# Patient Record
Sex: Male | Born: 1965 | Race: White | Hispanic: No | Marital: Married | Smoking: Never smoker
Health system: Southern US, Community
[De-identification: ages and names within clinical notes are randomized; demographics above are authoritative.]

## PROBLEM LIST (undated history)

## (undated) DIAGNOSIS — R51 Headache: Secondary | ICD-10-CM

## (undated) DIAGNOSIS — J4520 Mild intermittent asthma, uncomplicated: Secondary | ICD-10-CM

## (undated) DIAGNOSIS — R209 Unspecified disturbances of skin sensation: Secondary | ICD-10-CM

## (undated) DIAGNOSIS — M2550 Pain in unspecified joint: Secondary | ICD-10-CM

## (undated) DIAGNOSIS — H9190 Unspecified hearing loss, unspecified ear: Secondary | ICD-10-CM

## (undated) DIAGNOSIS — K219 Gastro-esophageal reflux disease without esophagitis: Secondary | ICD-10-CM

## (undated) DIAGNOSIS — M1990 Unspecified osteoarthritis, unspecified site: Secondary | ICD-10-CM

## (undated) DIAGNOSIS — M542 Cervicalgia: Secondary | ICD-10-CM

## (undated) DIAGNOSIS — M7711 Lateral epicondylitis, right elbow: Secondary | ICD-10-CM

## (undated) DIAGNOSIS — E785 Hyperlipidemia, unspecified: Secondary | ICD-10-CM

## (undated) DIAGNOSIS — R519 Headache, unspecified: Secondary | ICD-10-CM

## (undated) HISTORY — DX: Headache: R51

## (undated) HISTORY — DX: Unspecified disturbances of skin sensation: R20.9

## (undated) HISTORY — DX: Hyperlipidemia, unspecified: E78.5

## (undated) HISTORY — DX: Headache, unspecified: R51.9

## (undated) HISTORY — DX: Mild intermittent asthma, uncomplicated: J45.20

## (undated) HISTORY — DX: Pain in unspecified joint: M25.50

## (undated) HISTORY — DX: Unspecified hearing loss, unspecified ear: H91.90

## (undated) HISTORY — DX: Lateral epicondylitis, right elbow: M77.11

## (undated) HISTORY — DX: Cervicalgia: M54.2

## (undated) HISTORY — DX: Gastro-esophageal reflux disease without esophagitis: K21.9

## (undated) HISTORY — DX: Unspecified osteoarthritis, unspecified site: M19.90

---

## 2006-12-28 ENCOUNTER — Emergency Department: Payer: Self-pay | Admitting: Unknown Physician Specialty

## 2006-12-28 ENCOUNTER — Other Ambulatory Visit: Payer: Self-pay

## 2006-12-30 ENCOUNTER — Ambulatory Visit: Payer: Self-pay | Admitting: Unknown Physician Specialty

## 2012-03-22 ENCOUNTER — Ambulatory Visit: Payer: Self-pay | Admitting: Family Medicine

## 2012-04-13 ENCOUNTER — Emergency Department: Payer: Self-pay

## 2012-04-13 LAB — COMPREHENSIVE METABOLIC PANEL
Albumin: 4.1 g/dL (ref 3.4–5.0)
Anion Gap: 8 (ref 7–16)
BUN: 16 mg/dL (ref 7–18)
Calcium, Total: 8.6 mg/dL (ref 8.5–10.1)
Co2: 24 mmol/L (ref 21–32)
EGFR (African American): >60
Glucose: 135 mg/dL — ABNORMAL HIGH (ref 65–99)
Potassium: 4.3 mmol/L (ref 3.5–5.1)
SGOT(AST): 29 U/L (ref 15–37)
SGPT (ALT): 38 U/L (ref 12–78)

## 2012-04-13 LAB — CBC
HCT: 43 % (ref 40.0–52.0)
MCHC: 34.8 g/dL (ref 32.0–36.0)
Platelet: 283 10*3/uL (ref 150–440)
RBC: 4.68 10*6/uL (ref 4.40–5.90)
RDW: 12.2 % (ref 11.5–14.5)
WBC: 6.1 10*3/uL (ref 3.8–10.6)

## 2012-04-13 LAB — CK: CK, Total: 191 U/L (ref 35–232)

## 2012-04-13 LAB — ACETAMINOPHEN LEVEL: Acetaminophen: 3 ug/mL — ABNORMAL LOW

## 2014-04-19 LAB — LIPID PANEL
Cholesterol: 245 mg/dL — AB (ref 0–200)
HDL: 44 mg/dL (ref 35–70)
LDL Cholesterol: 175 mg/dL
Triglycerides: 129 mg/dL (ref 40–160)

## 2014-04-19 LAB — PSA: PSA: NORMAL

## 2014-05-16 IMAGING — CR DG CHEST 2V
1 series · 4 of 4 positions shown · non-contrast
Comparison: none

REASON FOR EXAM: fever, cough
COMMENTS:

PROCEDURE:     DXR - DXR CHEST PA (OR AP) AND LATERAL  - April 13, 2012 [DATE]
RESULT:     Comparison: None

[Series 1: pa · 0.17mm/px · 4 of 4 slices shown]
[im 1/4]
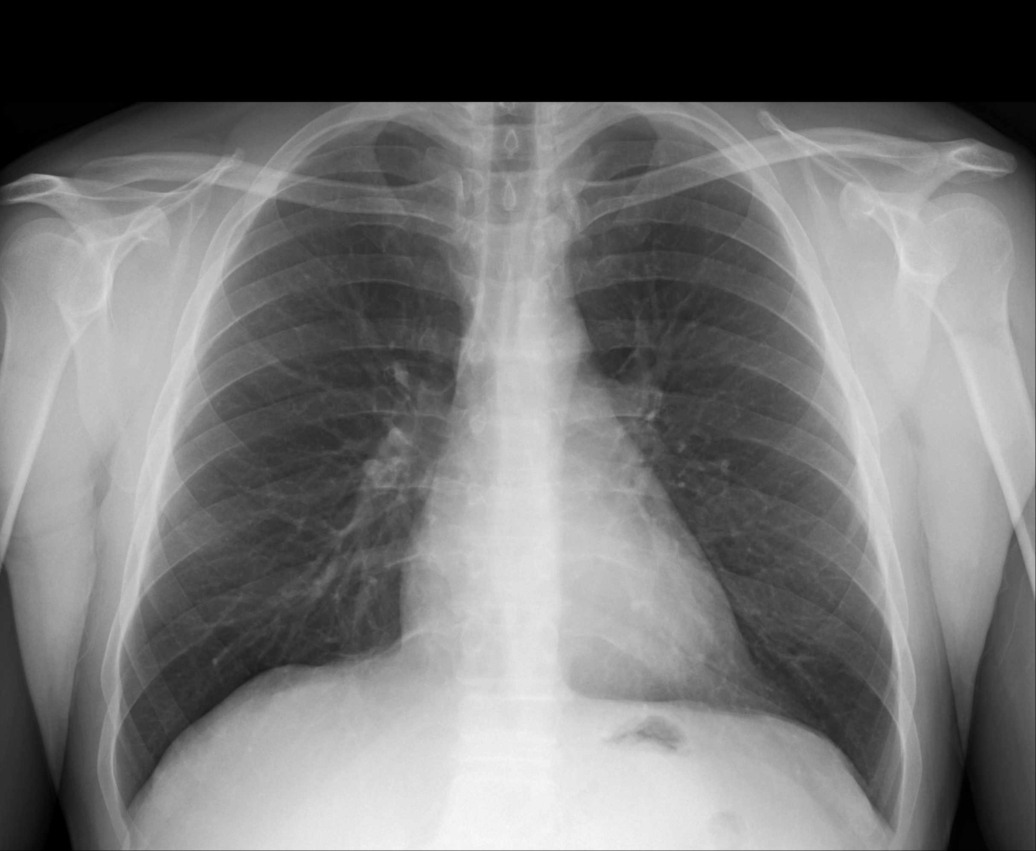
[im 2/4]
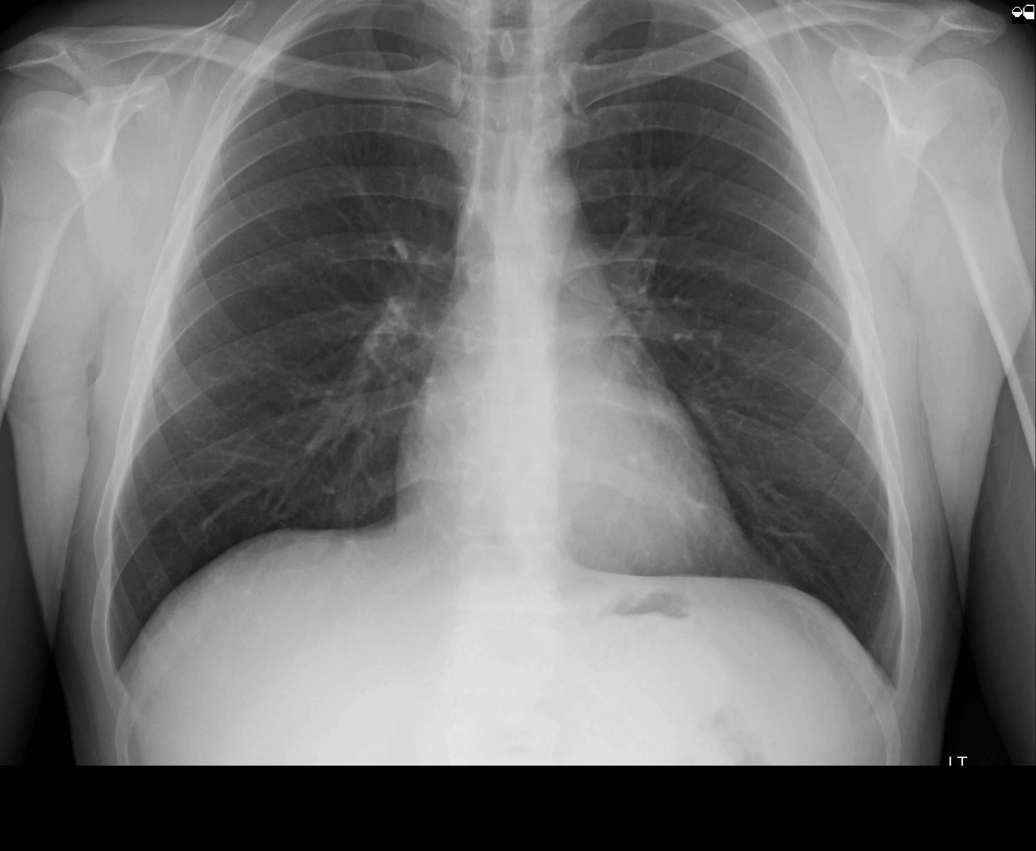
[im 3/4]
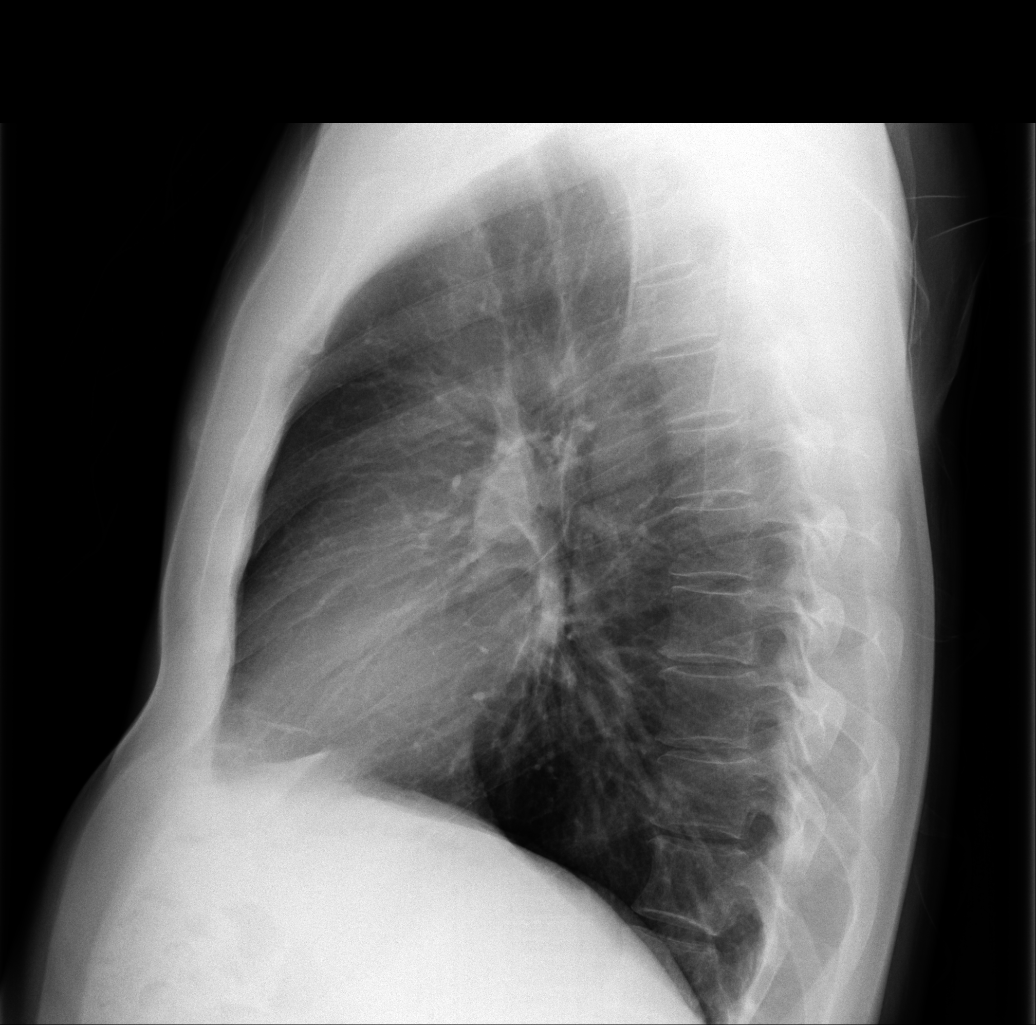
[im 4/4]
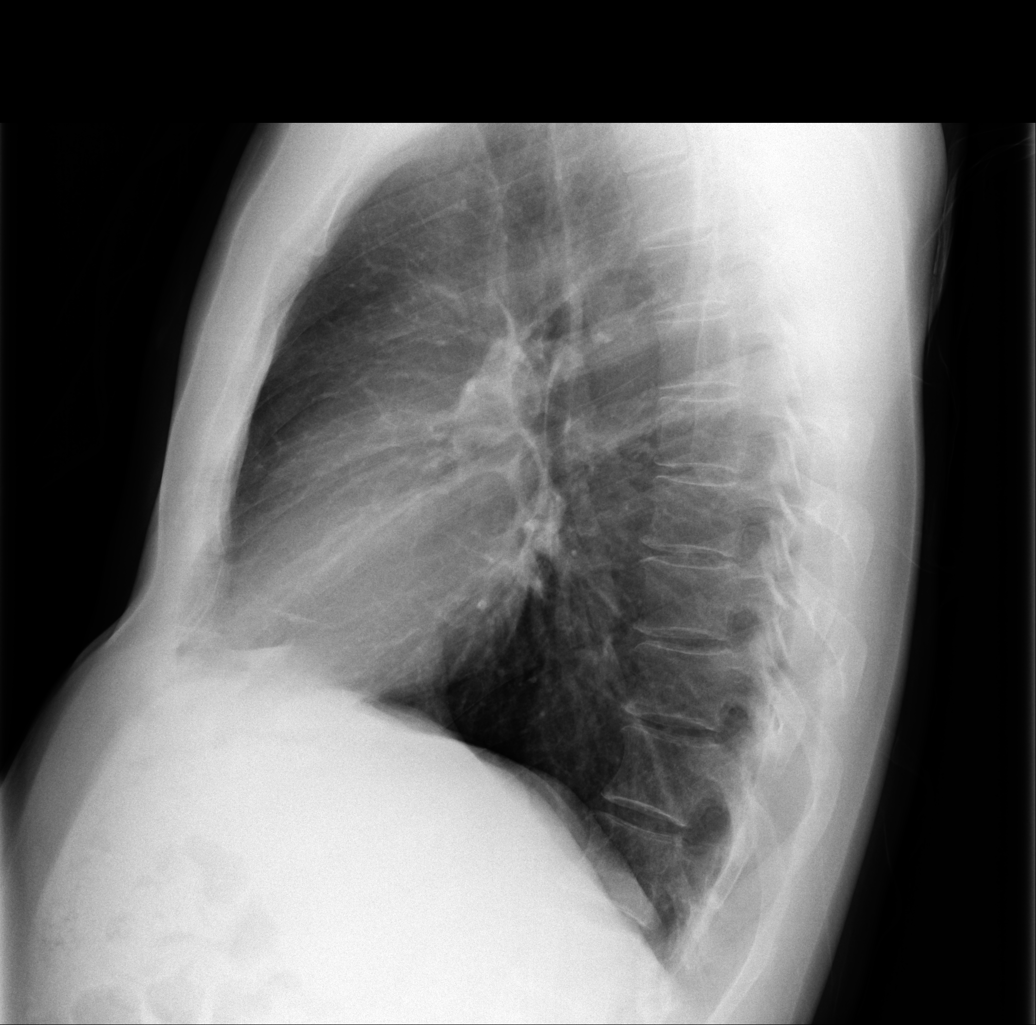

[4 of 4 positions shown; findings below may reference images not displayed]

FINDINGS: PA and lateral chest radiographs are provided.  There is no focal
parenchymal opacity, pleural effusion, or pneumothorax. The heart and
mediastinum are unremarkable.  The osseous structures are unremarkable.
IMPRESSION: No acute disease of the che[REDACTED]

## 2014-05-25 ENCOUNTER — Ambulatory Visit: Payer: Self-pay | Admitting: Gastroenterology

## 2014-10-04 DIAGNOSIS — R51 Headache: Secondary | ICD-10-CM

## 2014-10-04 DIAGNOSIS — R519 Headache, unspecified: Secondary | ICD-10-CM | POA: Insufficient documentation

## 2014-10-04 DIAGNOSIS — M542 Cervicalgia: Secondary | ICD-10-CM | POA: Insufficient documentation

## 2014-12-29 ENCOUNTER — Other Ambulatory Visit: Payer: Self-pay | Admitting: Family Medicine

## 2015-04-18 ENCOUNTER — Encounter (INDEPENDENT_AMBULATORY_CARE_PROVIDER_SITE_OTHER): Payer: Self-pay

## 2015-04-18 ENCOUNTER — Encounter: Payer: Self-pay | Admitting: Family Medicine

## 2015-04-18 ENCOUNTER — Ambulatory Visit (INDEPENDENT_AMBULATORY_CARE_PROVIDER_SITE_OTHER): Payer: No Typology Code available for payment source | Admitting: Family Medicine

## 2015-04-18 VITALS — BP 116/72 | HR 78 | Temp 98.1°F | Resp 16 | Ht 64.0 in | Wt 143.9 lb

## 2015-04-18 DIAGNOSIS — E785 Hyperlipidemia, unspecified: Secondary | ICD-10-CM | POA: Diagnosis not present

## 2015-04-18 DIAGNOSIS — M542 Cervicalgia: Secondary | ICD-10-CM

## 2015-04-18 DIAGNOSIS — K219 Gastro-esophageal reflux disease without esophagitis: Secondary | ICD-10-CM

## 2015-04-18 DIAGNOSIS — G4489 Other headache syndrome: Secondary | ICD-10-CM

## 2015-04-18 DIAGNOSIS — Z79899 Other long term (current) drug therapy: Secondary | ICD-10-CM | POA: Diagnosis not present

## 2015-04-18 DIAGNOSIS — Z0000 Encounter for general adult medical examination without abnormal findings: Secondary | ICD-10-CM

## 2015-04-18 DIAGNOSIS — J4520 Mild intermittent asthma, uncomplicated: Secondary | ICD-10-CM | POA: Insufficient documentation

## 2015-04-18 DIAGNOSIS — Z125 Encounter for screening for malignant neoplasm of prostate: Secondary | ICD-10-CM | POA: Diagnosis not present

## 2015-04-18 MED ORDER — DICLOFENAC SODIUM 75 MG PO TBEC: 75.0000 mg | DELAYED_RELEASE_TABLET | Freq: Every day | ORAL | Status: DC

## 2015-04-18 MED ORDER — ASPIRIN EC 81 MG PO TBEC: 81.0000 mg | DELAYED_RELEASE_TABLET | Freq: Every day | ORAL | Status: DC

## 2015-04-18 MED ORDER — ALBUTEROL SULFATE HFA 108 (90 BASE) MCG/ACT IN AERS: 2.0000 | INHALATION_SPRAY | Freq: Four times a day (QID) | RESPIRATORY_TRACT | Status: DC | PRN

## 2015-04-18 MED ORDER — NORTRIPTYLINE HCL 10 MG PO CAPS: ORAL_CAPSULE | ORAL | Status: DC

## 2015-04-18 NOTE — Progress Notes (Addendum)
Name: James Roman   MRN: [id number]    DOB: March 29, 1966   Date:04/18/2015       Progress Note  Subjective  Chief Complaint  Chief Complaint  Patient presents with  . Annual Exam    HPI  He came in for annual physical but also refill of chronic medications and follow up.  Annual exam: he denies ED, he has noticed nocturia once or twice weekly, no dribbling, denies weak urinary stream.   Asthma:    04/18/15 0826  Asthma History  Symptoms 0-2 days/week  Nighttime Awakenings 0-2/month  Asthma interference with normal activity No limitations  SABA use (not for EIB) 0-2 days/wk  Risk: Exacerbations requiring oral systemic steroids 0-1 / year  Asthma Severity Intermittent   Headache Syndrome: seen by Dr. Melrose Nakayama a few times, he has been taking Nortryptiline 20 mg qhs and is doing well on medication, headaches has gone from daily to occasional pain. Usually triggered when he has to round a lot of yarns by beating it with a plastic device. He feels his right shoulder getting sore and the right side of neck gets swollen and that triggers a headache that is described as sharp, on the right side. Improves with rest. Not associated with nausea or vomiting.   GERD: he is doing well on Omeprazole prn - usually before he splurges, no heartburn, no nausea, no epigastric pain  Neck pain: secondary to work, and better since started on Voltaren qhs and the inflammation has gone down, denies paresthesia or weakness .    Patient Active Problem List   Diagnosis Date Noted  . Hyperlipidemia 04/18/2015  . Asthma, mild intermittent, well-controlled 04/18/2015  . GERD (gastroesophageal reflux disease) 04/18/2015  . Cephalalgia 10/04/2014  . Cervical pain 10/04/2014    History reviewed. No pertinent past surgical history.  Family History  Problem Relation Age of Onset  . Hearing loss Father   . Hyperlipidemia Father   . Cancer Sister     skin  . Anemia Daughter     Social History    Social History  . Marital Status: Married    Spouse Name: N/A  . Number of Children: N/A  . Years of Education: N/A   Occupational History  . Not on file.   Social History Main Topics  . Smoking status: Never Smoker   . Smokeless tobacco: Never Used  . Alcohol Use: No  . Drug Use: No  . Sexual Activity:    Partners: Female   Other Topics Concern  . Not on file   Social History Narrative     Current outpatient prescriptions:  .  diclofenac (VOLTAREN) 75 MG EC tablet, Take 1 tablet (75 mg total) by mouth at bedtime., Disp: 60 tablet, Rfl: 5 .  nortriptyline (PAMELOR) 10 MG capsule, TAKE 2 CAPSULES (20 MG TOTAL) BY MOUTH NIGHTLY., Disp: 60 capsule, Rfl: 5 .  omeprazole (PRILOSEC) 20 MG capsule, TAKE ONE CAPSULE BY MOUTH EVERY MORNING, Disp: 90 capsule, Rfl: 4 .  albuterol (PROVENTIL HFA;VENTOLIN HFA) 108 (90 BASE) MCG/ACT inhaler, Inhale 2 puffs into the lungs every 6 (six) hours as needed for wheezing or shortness of breath., Disp: 1 Inhaler, Rfl: 0  Allergies  Allergen Reactions  . Penicillin G Other (See Comments)     ROS  Constitutional: Negative for fever or weight change.  Respiratory: Negative for cough and shortness of breath.   Cardiovascular: Negative for chest pain or palpitations.  Gastrointestinal: Negative for abdominal pain, no bowel changes.  Musculoskeletal: Negative for gait problem or joint swelling.  Skin: Negative for rash.  Neurological: Negative for dizziness , positive for occasional  headache.  No other specific complaints in a complete review of systems (except as listed in HPI above).  Objective  Filed Vitals:   04/18/15 0814  BP: 116/72  Pulse: 78  Temp: 98.1 F (36.7 C)  TempSrc: Oral  Resp: 16  Height: 5\' 4"  (1.626 m)  Weight: 143 lb 14.4 oz (65.273 kg)  SpO2: 98%    Body mass index is 24.69 kg/(m^2).  Physical Exam  Constitutional: Patient appears well-developed and well-nourished. No distress.  HENT: Head:  Normocephalic and atraumatic. Ears: B TMs ok, no erythema or effusion; Nose: Nose normal. Mouth/Throat: Oropharynx is clear and moist. No oropharyngeal exudate.  Eyes: Conjunctivae and EOM are normal. Pupils are equal, round, and reactive to light. No scleral icterus.  Neck: Normal range of motion. Neck supple. No JVD present. No thyromegaly present.  Cardiovascular: Normal rate, regular rhythm and normal heart sounds.  No murmur heard. No BLE edema. Pulmonary/Chest: Effort normal and breath sounds normal. No respiratory distress. Abdominal: Soft. Bowel sounds are normal, no distension. There is no tenderness. no masses MALE GENITALIA: Normal descended testes bilaterally, no masses palpated, no hernias, no lesions, no discharge RECTAL: Prostate normal size and consistency, no rectal masses or hemorrhoids Musculoskeletal: Normal range of motion, no joint effusions. No gross deformities Neurological: he is alert and oriented to person, place, and time. No cranial nerve deficit. Coordination, balance, strength, speech and gait are normal.  Skin: Skin is warm and dry. No rash noted. No erythema.  Psychiatric: Patient has a normal mood and affect. behavior is normal. Judgment and thought content normal.  PHQ2/9: Depression screen PHQ 2/9 04/18/2015  Decreased Interest 0  Down, Depressed, Hopeless 0  PHQ - 2 Score 0    Fall Risk: Fall Risk  04/18/2015  Falls in the past year? No    Functional Status Survey: Is the patient deaf or have difficulty hearing?: Yes (right ear due to [employer] ) Does the patient have difficulty seeing, even when wearing glasses/contacts?: Yes (glasses) Does the patient have difficulty concentrating, remembering, or making decisions?: No Does the patient have difficulty walking or climbing stairs?: No Does the patient have difficulty dressing or bathing?: No Does the patient have difficulty doing errands alone such as visiting a doctor's office or shopping?:  No   Assessment & Plan  1. Encounter for routine history and physical exam for male  Discussed importance of 150 minutes of physical activity weekly, eat two servings of fish weekly, eat one serving of tree nuts ( cashews, pistachios, pecans, almonds.Marland Kitchen) every other day, eat 6 servings of fruit/vegetables daily and drink plenty of water and avoid sweet beverages. Also discussed starting a 81 mg aspirin  2. Hyperlipidemia  - Lipid panel  3. Asthma, mild intermittent, well-controlled  - albuterol (PROVENTIL HFA;VENTOLIN HFA) 108 (90 BASE) MCG/ACT inhaler; Inhale 2 puffs into the lungs every 6 (six) hours as needed for wheezing or shortness of breath.  Dispense: 1 Inhaler; Refill: 0  4. Gastroesophageal reflux disease without esophagitis  Continue prn medication, he should try changing to Zantac otc  5. Other headache syndrome  He would like me to refill his medication, doing great on 2 pills at night and denies side effect - nortriptyline (PAMELOR) 10 MG capsule; TAKE 2 CAPSULES (20 MG TOTAL) BY MOUTH NIGHTLY.  Dispense: 60 capsule; Refill: 5  6. Cervical pain  -  diclofenac (VOLTAREN) 75 MG EC tablet; Take 1 tablet (75 mg total) by mouth at bedtime.  Dispense: 60 tablet; Refill: 5  7. Long-term use of high-risk medication  - Comprehensive metabolic panel - CBC with Differential/Platelet  8. Prostate cancer screening  Discussed current USPTF guidelines during CPE, he does not want to get a PSA level today

## 2015-04-19 LAB — CBC WITH DIFFERENTIAL/PLATELET
Basophils Absolute: 0 10*3/uL (ref 0.0–0.2)
Basos: 1 %
EOS (ABSOLUTE): 0.1 10*3/uL (ref 0.0–0.4)
EOS: 2 %
HEMATOCRIT: 47.6 % (ref 37.5–51.0)
HEMOGLOBIN: 15.7 g/dL (ref 12.6–17.7)
IMMATURE GRANS (ABS): 0 10*3/uL (ref 0.0–0.1)
IMMATURE GRANULOCYTES: 0 %
Lymphocytes Absolute: 2.1 10*3/uL (ref 0.7–3.1)
Lymphs: 43 %
MCH: 31.2 pg (ref 26.6–33.0)
MCHC: 33 g/dL (ref 31.5–35.7)
MCV: 95 fL (ref 79–97)
MONOCYTES: 13 %
Monocytes Absolute: 0.6 10*3/uL (ref 0.1–0.9)
NEUTROS PCT: 41 %
Neutrophils Absolute: 2 10*3/uL (ref 1.4–7.0)
Platelets: 333 10*3/uL (ref 150–379)
RBC: 5.03 x10E6/uL (ref 4.14–5.80)
RDW: 12.9 % (ref 12.3–15.4)
WBC: 4.9 10*3/uL (ref 3.4–10.8)

## 2015-04-19 LAB — LIPID PANEL
Chol/HDL Ratio: 7.1 ratio units — ABNORMAL HIGH (ref 0.0–5.0)
Cholesterol, Total: 270 mg/dL — ABNORMAL HIGH (ref 100–199)
HDL: 38 mg/dL — AB (ref >39)
LDL Calculated: 203 mg/dL — ABNORMAL HIGH (ref 0–99)
Triglycerides: 143 mg/dL (ref 0–149)
VLDL Cholesterol Cal: 29 mg/dL (ref 5–40)

## 2015-04-19 LAB — COMPREHENSIVE METABOLIC PANEL
A/G RATIO: 2 (ref 1.1–2.5)
ALT: 80 IU/L — AB (ref 0–44)
AST: 32 IU/L (ref 0–40)
Albumin: 4.5 g/dL (ref 3.5–5.5)
Alkaline Phosphatase: 67 IU/L (ref 39–117)
BILIRUBIN TOTAL: 0.4 mg/dL (ref 0.0–1.2)
BUN / CREAT RATIO: 15 (ref 9–20)
BUN: 16 mg/dL (ref 6–24)
CHLORIDE: 101 mmol/L (ref 97–108)
CO2: 24 mmol/L (ref 18–29)
Calcium: 9.4 mg/dL (ref 8.7–10.2)
Creatinine, Ser: 1.1 mg/dL (ref 0.76–1.27)
GFR calc non Af Amer: 77 mL/min/{1.73_m2} (ref >59)
GFR, EST AFRICAN AMERICAN: 89 mL/min/{1.73_m2} (ref >59)
Globulin, Total: 2.3 g/dL (ref 1.5–4.5)
Glucose: 103 mg/dL — ABNORMAL HIGH (ref 65–99)
POTASSIUM: 5 mmol/L (ref 3.5–5.2)
Sodium: 141 mmol/L (ref 134–144)
TOTAL PROTEIN: 6.8 g/dL (ref 6.0–8.5)

## 2015-04-21 ENCOUNTER — Other Ambulatory Visit: Payer: Self-pay | Admitting: Family Medicine

## 2015-04-21 DIAGNOSIS — R739 Hyperglycemia, unspecified: Secondary | ICD-10-CM | POA: Insufficient documentation

## 2015-04-21 DIAGNOSIS — E785 Hyperlipidemia, unspecified: Secondary | ICD-10-CM

## 2015-04-21 MED ORDER — ATORVASTATIN CALCIUM 40 MG PO TABS: 40.0000 mg | ORAL_TABLET | Freq: Every day | ORAL | Status: DC

## 2015-04-22 ENCOUNTER — Telehealth: Payer: Self-pay

## 2015-04-22 NOTE — Telephone Encounter (Signed)
-----   Message from Steele Sizer, MD sent at 04/21/2015 10:04 PM EDT ----- Hyperglycemia, we will add hgbA1C level to rule out diabetes Normal kidney and liver function test, except for one of the liver enzymes that was a little high and we will monitor it, he should return in 3 months for lab recheck Lipid panel is abnormal, LDL is very high and out of control, he really needs to start statin to decrease chance of heart attacks and strokes.  I will send prescription of Atorvastatin 40 mg and needs to return in 3 months to recheck labs Normal CBC

## 2015-04-22 NOTE — Progress Notes (Signed)
Patient notified and states he will start the Atorvastatin medication and follow up in 3 months.

## 2015-04-22 NOTE — Telephone Encounter (Signed)
Left vm for patient to return call for lab results  °

## 2015-04-22 NOTE — Progress Notes (Signed)
Lab added on to existing blood work.

## 2015-04-24 LAB — HGB A1C W/O EAG: HEMOGLOBIN A1C: 5.8 % — AB (ref 4.8–5.6)

## 2015-04-27 LAB — SPECIMEN STATUS REPORT

## 2015-07-17 ENCOUNTER — Other Ambulatory Visit: Payer: Self-pay | Admitting: Family Medicine

## 2015-07-17 NOTE — Telephone Encounter (Signed)
Patient requesting refill. 

## 2015-07-24 ENCOUNTER — Ambulatory Visit (INDEPENDENT_AMBULATORY_CARE_PROVIDER_SITE_OTHER): Payer: No Typology Code available for payment source | Admitting: Family Medicine

## 2015-07-24 ENCOUNTER — Encounter: Payer: Self-pay | Admitting: Family Medicine

## 2015-07-24 VITALS — BP 118/74 | HR 79 | Temp 98.4°F | Resp 14 | Ht 64.0 in | Wt 144.4 lb

## 2015-07-24 DIAGNOSIS — M542 Cervicalgia: Secondary | ICD-10-CM | POA: Diagnosis not present

## 2015-07-24 DIAGNOSIS — Z79899 Other long term (current) drug therapy: Secondary | ICD-10-CM

## 2015-07-24 DIAGNOSIS — K219 Gastro-esophageal reflux disease without esophagitis: Secondary | ICD-10-CM | POA: Diagnosis not present

## 2015-07-24 DIAGNOSIS — R739 Hyperglycemia, unspecified: Secondary | ICD-10-CM | POA: Diagnosis not present

## 2015-07-24 DIAGNOSIS — M25522 Pain in left elbow: Secondary | ICD-10-CM

## 2015-07-24 DIAGNOSIS — E785 Hyperlipidemia, unspecified: Secondary | ICD-10-CM | POA: Diagnosis not present

## 2015-07-24 DIAGNOSIS — J4520 Mild intermittent asthma, uncomplicated: Secondary | ICD-10-CM | POA: Diagnosis not present

## 2015-07-24 DIAGNOSIS — H9313 Tinnitus, bilateral: Secondary | ICD-10-CM | POA: Diagnosis not present

## 2015-07-24 DIAGNOSIS — G4489 Other headache syndrome: Secondary | ICD-10-CM | POA: Diagnosis not present

## 2015-07-24 MED ORDER — ATORVASTATIN CALCIUM 40 MG PO TABS: 40.0000 mg | ORAL_TABLET | Freq: Every day | ORAL | Status: DC

## 2015-07-24 MED ORDER — DICLOFENAC SODIUM 75 MG PO TBEC: 75.0000 mg | DELAYED_RELEASE_TABLET | Freq: Every day | ORAL | Status: DC

## 2015-07-24 NOTE — Progress Notes (Signed)
Name: James Roman   MRN: [id number]    DOB: 20-Sep-1965   Date:07/24/2015       Progress Note  Subjective  Chief Complaint  Chief Complaint  Patient presents with  . Medication Refill    follow-up  . Hyperlipidemia  . Tinnitus    onset months and worsening  . Elbow Pain    been painful hit it a couple of weeks ago    HPI  Headache Syndrome: seen by Dr. Melrose Nakayama a few times, he has been taking Nortryptiline 20 mg qhs and Voltaren at night, and is doing well on medication, headaches has gone from daily to occasional pain. Usually triggered when he has to round a lot of yarns by beating it with a plastic device. He feels his right shoulder getting sore and the right side of neck gets swollen and that triggers a headache that is described as sharp, on the right side.. Not associated with nausea or vomiting. Last episode was months ago.   GERD: he is doing well on Omeprazole prn - usually before he splurges, no heartburn, no nausea, no epigastric pain or regurgitation   Neck pain: secondary to work, and better since started on Voltaren qhs and the inflammation has gone down, denies paresthesia or weakness .   Tinnitus: he has noticed high pitch ringing in both ears intermittently months ago, but over the past week it has been constant. More noticeable when in a quite environment. Discussed possibly from Voltaren, aspirin or noise exposure. He will try backing off Voltaren adn see if symptoms improves, he will call back for referral to ENT if needed  Hyperglycemia: discussed pre-diabetes and importance of dietary modification. Denies polyphagia, polydipsia or polyuria.   Hyperlipidemia: last LDL above 200 and he was started on statin therapy, also discussed ways to increase HDL.    Left elbow pain: he states he hit left elbow on something about 3 weeks ago and still has some soreness when he touches the area, normal rom of elbow, no bruising or redness.   Asthma Mild Intermittent: denies  SOB, cough or wheezing, feeling well at this time  Patient Active Problem List   Diagnosis Date Noted  . Hyperglycemia 04/21/2015  . Hyperlipidemia 04/18/2015  . Asthma, mild intermittent, well-controlled 04/18/2015  . GERD (gastroesophageal reflux disease) 04/18/2015  . Cephalalgia 10/04/2014  . Cervical pain 10/04/2014    History reviewed. No pertinent past surgical history.  Family History  Problem Relation Age of Onset  . Hearing loss Father   . Hyperlipidemia Father   . Cancer Sister     skin  . Anemia Daughter     Social History   Social History  . Marital Status: Married    Spouse Name: N/A  . Number of Children: N/A  . Years of Education: N/A   Occupational History  . Not on file.   Social History Main Topics  . Smoking status: Never Smoker   . Smokeless tobacco: Never Used  . Alcohol Use: No  . Drug Use: No  . Sexual Activity:    Partners: Female   Other Topics Concern  . Not on file   Social History Narrative     Current outpatient prescriptions:  .  albuterol (PROVENTIL HFA;VENTOLIN HFA) 108 (90 BASE) MCG/ACT inhaler, Inhale 2 puffs into the lungs every 6 (six) hours as needed for wheezing or shortness of breath., Disp: 1 Inhaler, Rfl: 0 .  aspirin EC 81 MG tablet, Take 1 tablet (81 mg total)  by mouth daily., Disp: 30 tablet, Rfl: 0 .  atorvastatin (LIPITOR) 40 MG tablet, Take 1 tablet (40 mg total) by mouth daily at 6 PM., Disp: 90 tablet, Rfl: 1 .  diclofenac (VOLTAREN) 75 MG EC tablet, Take 1 tablet (75 mg total) by mouth at bedtime., Disp: 90 tablet, Rfl: 0 .  nortriptyline (PAMELOR) 10 MG capsule, TAKE 2 CAPSULES (20 MG TOTAL) BY MOUTH NIGHTLY., Disp: 60 capsule, Rfl: 5  Allergies  Allergen Reactions  . Penicillin G Other (See Comments)     ROS  Constitutional: Negative for fever or weight change.  Respiratory: Negative for cough and shortness of breath.   Cardiovascular: Negative for chest pain or palpitations.  Gastrointestinal:  Negative for abdominal pain, no bowel changes.  Musculoskeletal: Negative for gait problem or joint swelling.  Skin: Negative for rash.  Neurological: Negative for dizziness or headache.  No other specific complaints in a complete review of systems (except as listed in HPI above).  Objective  Filed Vitals:   07/24/15 0801  BP: 118/74  Pulse: 79  Temp: 98.4 F (36.9 C)  TempSrc: Oral  Resp: 14  Height: 5\' 4"  (1.626 m)  Weight: 144 lb 6.4 oz (65.499 kg)  SpO2: 96%    Body mass index is 24.77 kg/(m^2).  Physical Exam  Constitutional: Patient appears well-developed and well-nourished.  No distress.  HEENT: head atraumatic, normocephalic, pupils equal and reactive to light, neck supple, throat within normal limits Cardiovascular: Normal rate, regular rhythm and normal heart sounds.  No murmur heard. No BLE edema. Pulmonary/Chest: Effort normal and breath sounds normal. No respiratory distress. Abdominal: Soft.  There is no tenderness. Psychiatric: Patient has a normal mood and affect. behavior is normal. Judgment and thought content normal. Muscular Skeletal: normal neck exam    PHQ2/9: Depression screen [facility] 2/9 07/24/2015 04/18/2015  Decreased Interest 0 0  Down, Depressed, Hopeless 0 0  PHQ - 2 Score 0 0     Fall Risk: Fall Risk  07/24/2015 04/18/2015  Falls in the past year? No No     Functional Status Survey: Is the patient deaf or have difficulty hearing?: No Does the patient have difficulty seeing, even when wearing glasses/contacts?: Yes Does the patient have difficulty concentrating, remembering, or making decisions?: No Does the patient have difficulty walking or climbing stairs?: No Does the patient have difficulty dressing or bathing?: No Does the patient have difficulty doing errands alone such as visiting a doctor's office or shopping?: No   Assessment & Plan  1. Hyperlipidemia  - atorvastatin (LIPITOR) 40 MG tablet; Take 1 tablet (40 mg total) by  mouth daily at 6 PM.  Dispense: 90 tablet; Refill: 1 - Lipid panel  2. Asthma, mild intermittent, well-controlled  Doing well on prn medication   3. Hyperglycemia  Discussed importance to back down on pasta   4. Cervical pain  - diclofenac (VOLTAREN) 75 MG EC tablet; Take 1 tablet (75 mg total) by mouth at bedtime.  Dispense: 90 tablet; Refill: 0  5. Gastroesophageal reflux disease without esophagitis  Doing well on prn medication   6. Other headache syndrome  - diclofenac (VOLTAREN) 75 MG EC tablet; Take 1 tablet (75 mg total) by mouth at bedtime.  Dispense: 90 tablet; Refill: 0  7. Tinnitus, bilateral  Discussed options, he wants to hold off on ENT referral, he will try stopping nsaid's and see if it improves  8. Long-term use of high-risk medication  - AST - ALT  9. Left elbow pain  Reassurance for now

## 2015-10-17 ENCOUNTER — Ambulatory Visit: Payer: No Typology Code available for payment source | Admitting: Family Medicine

## 2015-11-05 ENCOUNTER — Other Ambulatory Visit: Payer: Self-pay | Admitting: Family Medicine

## 2016-01-22 ENCOUNTER — Encounter: Payer: Self-pay | Admitting: Family Medicine

## 2016-01-22 ENCOUNTER — Ambulatory Visit (INDEPENDENT_AMBULATORY_CARE_PROVIDER_SITE_OTHER): Payer: No Typology Code available for payment source | Admitting: Family Medicine

## 2016-01-22 VITALS — BP 118/70 | HR 72 | Temp 97.8°F | Resp 16 | Wt 146.8 lb

## 2016-01-22 DIAGNOSIS — R5383 Other fatigue: Secondary | ICD-10-CM | POA: Diagnosis not present

## 2016-01-22 DIAGNOSIS — J4520 Mild intermittent asthma, uncomplicated: Secondary | ICD-10-CM | POA: Diagnosis not present

## 2016-01-22 DIAGNOSIS — K219 Gastro-esophageal reflux disease without esophagitis: Secondary | ICD-10-CM

## 2016-01-22 DIAGNOSIS — M25561 Pain in right knee: Secondary | ICD-10-CM

## 2016-01-22 DIAGNOSIS — T148 Other injury of unspecified body region: Secondary | ICD-10-CM

## 2016-01-22 DIAGNOSIS — M542 Cervicalgia: Secondary | ICD-10-CM

## 2016-01-22 DIAGNOSIS — E785 Hyperlipidemia, unspecified: Secondary | ICD-10-CM | POA: Diagnosis not present

## 2016-01-22 DIAGNOSIS — R739 Hyperglycemia, unspecified: Secondary | ICD-10-CM

## 2016-01-22 DIAGNOSIS — H9313 Tinnitus, bilateral: Secondary | ICD-10-CM | POA: Diagnosis not present

## 2016-01-22 DIAGNOSIS — G4489 Other headache syndrome: Secondary | ICD-10-CM | POA: Diagnosis not present

## 2016-01-22 DIAGNOSIS — W57XXXA Bitten or stung by nonvenomous insect and other nonvenomous arthropods, initial encounter: Secondary | ICD-10-CM | POA: Diagnosis not present

## 2016-01-22 DIAGNOSIS — Z79899 Other long term (current) drug therapy: Secondary | ICD-10-CM | POA: Diagnosis not present

## 2016-01-22 LAB — CBC WITH DIFFERENTIAL/PLATELET
BASOS ABS: 63 {cells}/uL (ref 0–200)
Basophils Relative: 1 %
Eosinophils Absolute: 126 cells/uL (ref 15–500)
Eosinophils Relative: 2 %
HEMATOCRIT: 47.3 % (ref 38.5–50.0)
Hemoglobin: 16.1 g/dL (ref 13.2–17.1)
LYMPHS ABS: 2205 {cells}/uL (ref 850–3900)
LYMPHS PCT: 35 %
MCH: 31.3 pg (ref 27.0–33.0)
MCHC: 34 g/dL (ref 32.0–36.0)
MCV: 91.8 fL (ref 80.0–100.0)
MPV: 9.4 fL (ref 7.5–12.5)
Monocytes Absolute: 630 cells/uL (ref 200–950)
Monocytes Relative: 10 %
NEUTROS ABS: 3276 {cells}/uL (ref 1500–7800)
NEUTROS PCT: 52 %
Platelets: 300 10*3/uL (ref 140–400)
RBC: 5.15 MIL/uL (ref 4.20–5.80)
RDW: 12.9 % (ref 11.0–15.0)
WBC: 6.3 10*3/uL (ref 3.8–10.8)

## 2016-01-22 LAB — COMPREHENSIVE METABOLIC PANEL
ALBUMIN: 4.6 g/dL (ref 3.6–5.1)
ALT: 46 U/L (ref 9–46)
AST: 25 U/L (ref 10–35)
Alkaline Phosphatase: 73 U/L (ref 40–115)
BILIRUBIN TOTAL: 0.6 mg/dL (ref 0.2–1.2)
BUN: 18 mg/dL (ref 7–25)
CHLORIDE: 106 mmol/L (ref 98–110)
CO2: 22 mmol/L (ref 20–31)
CREATININE: 1.17 mg/dL (ref 0.70–1.33)
Calcium: 9.7 mg/dL (ref 8.6–10.3)
GLUCOSE: 92 mg/dL (ref 65–99)
Potassium: 4.7 mmol/L (ref 3.5–5.3)
SODIUM: 140 mmol/L (ref 135–146)
Total Protein: 7 g/dL (ref 6.1–8.1)

## 2016-01-22 LAB — LIPID PANEL
CHOL/HDL RATIO: 3.8 ratio (ref <=5.0)
Cholesterol: 168 mg/dL (ref 125–200)
HDL: 44 mg/dL (ref >=40)
LDL CALC: 106 mg/dL (ref <130)
Triglycerides: 90 mg/dL (ref <150)
VLDL: 18 mg/dL (ref <30)

## 2016-01-22 MED ORDER — ASPIRIN EC 81 MG PO TBEC: 81.0000 mg | DELAYED_RELEASE_TABLET | Freq: Every day | ORAL | Status: DC

## 2016-01-22 MED ORDER — NORTRIPTYLINE HCL 10 MG PO CAPS: 20.0000 mg | ORAL_CAPSULE | Freq: Every day | ORAL | Status: DC

## 2016-01-22 MED ORDER — MELOXICAM 15 MG PO TABS: 15.0000 mg | ORAL_TABLET | Freq: Every day | ORAL | Status: DC

## 2016-01-22 MED ORDER — ATORVASTATIN CALCIUM 40 MG PO TABS: 40.0000 mg | ORAL_TABLET | Freq: Every day | ORAL | Status: DC

## 2016-01-22 NOTE — Progress Notes (Signed)
Name: James Roman   MRN: [id number]    DOB: June 23, 1966   Date:01/22/2016       Progress Note  Subjective  Chief Complaint  Chief Complaint  Patient presents with  . Follow-up    6 month f/u    HPI  Headache Syndrome: seen by Dr. Melrose Nakayama a few times, he has been taking Nortryptiline 20 mg qhs and Voltaren at night, headaches has gone from daily to occasional pain. He feels his right shoulder getting sore and the right side of neck gets swollen and that triggers a headache that is described as sharp, on the right side. Not associated with nausea or vomiting. He states last episode was a few days ago, this time trigger because he was laying down all day.   GERD: he is doing well on Omeprazole prn - usually before he splurges, no heartburn, no nausea, no epigastric pain or regurgitation   Neck pain: secondary to work, he was better after he  started on Voltaren qhs and the inflammation has gone down, denies paresthesia or weakness . However the pain is getting more frequent, described as a tightness sensation on right side of neck - constant for the past couple of weeks, we will try changing to Meloxicam. He has not responded to muscle relaxer in the past, chiropractor helped but temporarily and he can't afford to go back at this time. He would like to hold off on x-rays or referrals at this time  Tinnitus: he has noticed high pitch ringing in both ears intermittently months ago, it has been constant for 6 months now. He skipped a few days of Voltaren without any improvement of symptoms.  More noticeable when in a quite environment. He has not started aspirin yet and is willing to see ENT at this time  Hyperglycemia: discussed pre-diabetes and importance of dietary modification. Denies polyphagia, polydipsia or polyuria.   Hyperlipidemia: last LDL above 200 and he was started on statin therapy last Fall, also discussed ways to increase HDL.   Asthma Mild Intermittent: denies SOB, cough or  wheezing, feeling well at this time  Tick Bite and history of lyme disease: [place] ticks on him, he has been feeling tired and would like to be rechecked for lyme and have other labs done  Right knee pain: he states that for the past year he has noticed sharp anterior knee pain, no effusion or redness, pain is intermittent, at times he limps because of the pain. He has a history of tendinitis on the right knee  Patient Active Problem List   Diagnosis Date Noted  . Hyperglycemia 04/21/2015  . Hyperlipidemia 04/18/2015  . Asthma, mild intermittent, well-controlled 04/18/2015  . GERD (gastroesophageal reflux disease) 04/18/2015  . Cephalalgia 10/04/2014  . Cervical pain 10/04/2014    History reviewed. No pertinent past surgical history.  Family History  Problem Relation Age of Onset  . Hearing loss Father   . Hyperlipidemia Father   . Cancer Sister     skin  . Anemia Daughter     Social History   Social History  . Marital Status: Married    Spouse Name: N/A  . Number of Children: N/A  . Years of Education: N/A   Occupational History  . Not on file.   Social History Main Topics  . Smoking status: Never Smoker   . Smokeless tobacco: Never Used  . Alcohol Use: No  . Drug Use: No  .  Sexual Activity:    Partners: Female   Other Topics Concern  . Not on file   Social History Narrative   Married and has one step-daughter at home ( almost 48 yo )   He has a daughter that was given for adoption as infant, he knows where she lives but is not involved in her life.    He also has another grown daughter that has two teenager children     Current outpatient prescriptions:  .  atorvastatin (LIPITOR) 40 MG tablet, Take 1 tablet (40 mg total) by mouth daily at 6 PM., Disp: 90 tablet, Rfl: 1 .  nortriptyline (PAMELOR) 10 MG capsule, Take 2 capsules (20 mg total) by mouth at bedtime., Disp: 180 capsule, Rfl: 1 .  albuterol (PROVENTIL  HFA;VENTOLIN HFA) 108 (90 BASE) MCG/ACT inhaler, Inhale 2 puffs into the lungs every 6 (six) hours as needed for wheezing or shortness of breath. (Patient not taking: Reported on 01/22/2016), Disp: 1 Inhaler, Rfl: 0 .  aspirin EC 81 MG tablet, Take 1 tablet (81 mg total) by mouth daily., Disp: 100 tablet, Rfl: 1 .  meloxicam (MOBIC) 15 MG tablet, Take 1 tablet (15 mg total) by mouth daily., Disp: 90 tablet, Rfl: 1  Allergies  Allergen Reactions  . Penicillin G Other (See Comments)     ROS  Constitutional: Negative for fever or weight change. He feels tired and has a history of Lyme diseas Respiratory: Negative for cough and shortness of breath.   Cardiovascular: Negative for chest pain or palpitations.  Gastrointestinal: Negative for abdominal pain, no bowel changes.  Musculoskeletal: Negative for gait problem or joint swelling.  Skin: Negative for rash.  Neurological: Negative for dizziness , positive for intermittent  headache.  No other specific complaints in a complete review of systems (except as listed in HPI above).  Objective  Filed Vitals:   01/22/16 0811  BP: 118/70  Pulse: 72  Temp: 97.8 F (36.6 C)  TempSrc: Oral  Resp: 16  Weight: 146 lb 12.8 oz (66.588 kg)  SpO2: 98%    Body mass index is 25.19 kg/(m^2).  Physical Exam  Constitutional: Patient appears well-developed and well-nourished. Obese  No distress.  HEENT: head atraumatic, normocephalic, pupils equal and reactive to light, ears normal TM, neck supple, pain during palpation of right side of neck throat within normal limits Cardiovascular: Normal rate, regular rhythm and normal heart sounds.  No murmur heard. No BLE edema. Pulmonary/Chest: Effort normal and breath sounds normal. No respiratory distress. Abdominal: Soft.  There is no tenderness. Psychiatric: Patient has a normal mood and affect. behavior is normal. Judgment and thought content normal. Muscular Skeletal: normal right knee  exam  PHQ2/9: Depression screen [facility] 2/9 01/22/2016 07/24/2015 04/18/2015  Decreased Interest 0 0 0  Down, Depressed, Hopeless 0 0 0  PHQ - 2 Score 0 0 0     Fall Risk: Fall Risk  01/22/2016 07/24/2015 04/18/2015  Falls in the past year? No No No     Functional Status Survey: Is the patient deaf or have difficulty hearing?: No Does the patient have difficulty seeing, even when wearing glasses/contacts?: No Does the patient have difficulty concentrating, remembering, or making decisions?: No Does the patient have difficulty walking or climbing stairs?: No Does the patient have difficulty dressing or bathing?: No Does the patient have difficulty doing errands alone such as visiting a doctor's office or shopping?: No    Assessment & Plan  1. Hyperlipidemia  - atorvastatin (LIPITOR) 40 MG  tablet; Take 1 tablet (40 mg total) by mouth daily at 6 PM.  Dispense: 90 tablet; Refill: 1 - Lipid panel  2. Asthma, mild intermittent, well-controlled  -spirometry - normal   3. Gastroesophageal reflux disease without esophagitis  Well controlled at this time  4. Cervical pain  - meloxicam (MOBIC) 15 MG tablet; Take 1 tablet (15 mg total) by mouth daily.  Dispense: 90 tablet; Refill: 1  5. Tinnitus, bilateral  - Ambulatory referral to ENT  6. Other headache syndrome  - nortriptyline (PAMELOR) 10 MG capsule; Take 2 capsules (20 mg total) by mouth at bedtime.  Dispense: 180 capsule; Refill: 1  7. Hyperglycemia  - Hemoglobin A1c  8. Long-term use of high-risk medication  - Comprehensive metabolic panel  1. Hyperlipidemia  -lipid panel  9. Other fatigue  - Vitamin B12 - VITAMIN D 25 Hydroxy (Vit-D Deficiency, Fractures) - TSH - CBC with Differential/Platelet - Lyme Ab/Western Blot Reflex  10. Tick bite  - Lyme Ab/Western Blot Reflex  11. Right knee pain  - Ambulatory referral to Orthopedic Surgery

## 2016-01-23 ENCOUNTER — Encounter: Payer: Self-pay | Admitting: Family Medicine

## 2016-01-23 LAB — TSH: TSH: 1.33 m[IU]/L (ref 0.40–4.50)

## 2016-01-23 LAB — VITAMIN D 25 HYDROXY (VIT D DEFICIENCY, FRACTURES): Vit D, 25-Hydroxy: 30 ng/mL (ref 30–100)

## 2016-01-23 LAB — HEMOGLOBIN A1C
Hgb A1c MFr Bld: 5.6 % (ref <5.7)
Mean Plasma Glucose: 114 mg/dL

## 2016-01-23 LAB — VITAMIN B12: Vitamin B-12: 407 pg/mL (ref 200–1100)

## 2016-01-27 LAB — LYME AB/WESTERN BLOT REFLEX

## 2016-05-25 ENCOUNTER — Ambulatory Visit: Payer: No Typology Code available for payment source | Admitting: Family Medicine

## 2016-06-24 ENCOUNTER — Other Ambulatory Visit: Payer: Self-pay | Admitting: Family Medicine

## 2016-06-24 DIAGNOSIS — G4489 Other headache syndrome: Secondary | ICD-10-CM

## 2016-06-24 NOTE — Telephone Encounter (Signed)
Patient requesting refill of Nortriptyline to [facility].  

## 2016-06-26 ENCOUNTER — Other Ambulatory Visit: Payer: Self-pay | Admitting: Family Medicine

## 2016-06-26 DIAGNOSIS — M542 Cervicalgia: Secondary | ICD-10-CM

## 2016-07-14 ENCOUNTER — Other Ambulatory Visit: Payer: Self-pay | Admitting: Family Medicine

## 2016-07-14 DIAGNOSIS — E785 Hyperlipidemia, unspecified: Secondary | ICD-10-CM

## 2016-07-14 NOTE — Telephone Encounter (Signed)
Patient requesting refill of Aspirin 81 mg and Atorvastatin to [facility].

## 2016-07-15 NOTE — Telephone Encounter (Signed)
Left voice message that prescriptions have been sent to pharmacy however patient is needing to schedule appointment.

## 2016-08-27 LAB — TSH: TSH: 0.86 u[IU]/mL (ref 0.41–5.90)

## 2016-08-27 LAB — HEPATIC FUNCTION PANEL
ALT: 19 U/L (ref 10–40)
AST: 18 U/L (ref 14–40)
Alkaline Phosphatase: 69 U/L (ref 25–125)

## 2016-08-27 LAB — PSA: PSA: 1

## 2016-08-27 LAB — BASIC METABOLIC PANEL
BUN: 22 mg/dL — AB (ref 4–21)
Glucose: 103 mg/dL
POTASSIUM: 4.8 mmol/L (ref 3.4–5.3)
Sodium: 139 mmol/L (ref 137–147)

## 2016-08-27 LAB — LIPID PANEL
CHOLESTEROL: 209 mg/dL — AB (ref 0–200)
HDL: 41 mg/dL (ref 35–70)
LDL Cholesterol: 144 mg/dL
TRIGLYCERIDES: 118 mg/dL (ref 40–160)

## 2016-08-27 LAB — VITAMIN D 25 HYDROXY (VIT D DEFICIENCY, FRACTURES): Vit D, 25-Hydroxy: 11.8

## 2016-09-04 ENCOUNTER — Encounter: Payer: Self-pay | Admitting: Family Medicine

## 2016-09-04 ENCOUNTER — Other Ambulatory Visit: Payer: Self-pay | Admitting: Family Medicine

## 2016-09-04 DIAGNOSIS — E559 Vitamin D deficiency, unspecified: Secondary | ICD-10-CM | POA: Insufficient documentation

## 2016-09-04 DIAGNOSIS — R790 Abnormal level of blood mineral: Secondary | ICD-10-CM | POA: Insufficient documentation

## 2016-09-04 MED ORDER — VITAMIN D (ERGOCALCIFEROL) 1.25 MG (50000 UNIT) PO CAPS: 50000.0000 [IU] | ORAL_CAPSULE | ORAL | 0 refills | Status: DC

## 2016-09-08 ENCOUNTER — Other Ambulatory Visit: Payer: Self-pay | Admitting: Family Medicine

## 2016-09-09 ENCOUNTER — Encounter: Payer: Self-pay | Admitting: Family Medicine

## 2016-09-25 ENCOUNTER — Other Ambulatory Visit: Payer: Self-pay | Admitting: Family Medicine

## 2016-12-20 ENCOUNTER — Other Ambulatory Visit: Payer: Self-pay | Admitting: Family Medicine

## 2016-12-20 DIAGNOSIS — G4489 Other headache syndrome: Secondary | ICD-10-CM

## 2016-12-21 ENCOUNTER — Other Ambulatory Visit: Payer: Self-pay | Admitting: Family Medicine

## 2016-12-21 NOTE — Telephone Encounter (Signed)
Patient requesting refill of Vitamin D to [facility]. 

## 2016-12-26 ENCOUNTER — Other Ambulatory Visit: Payer: Self-pay | Admitting: Family Medicine

## 2016-12-26 DIAGNOSIS — M542 Cervicalgia: Secondary | ICD-10-CM

## 2016-12-28 NOTE — Telephone Encounter (Signed)
Patient requesting refill of Meloxicam to [facility].  

## 2017-02-26 ENCOUNTER — Ambulatory Visit (INDEPENDENT_AMBULATORY_CARE_PROVIDER_SITE_OTHER): Payer: 59 | Admitting: Family Medicine

## 2017-02-26 ENCOUNTER — Encounter: Payer: Self-pay | Admitting: Family Medicine

## 2017-02-26 VITALS — BP 110/72 | HR 63 | Temp 98.4°F | Resp 16 | Ht 64.0 in | Wt 136.4 lb

## 2017-02-26 DIAGNOSIS — M542 Cervicalgia: Secondary | ICD-10-CM

## 2017-02-26 DIAGNOSIS — K219 Gastro-esophageal reflux disease without esophagitis: Secondary | ICD-10-CM

## 2017-02-26 DIAGNOSIS — D171 Benign lipomatous neoplasm of skin and subcutaneous tissue of trunk: Secondary | ICD-10-CM | POA: Diagnosis not present

## 2017-02-26 DIAGNOSIS — R739 Hyperglycemia, unspecified: Secondary | ICD-10-CM

## 2017-02-26 DIAGNOSIS — R634 Abnormal weight loss: Secondary | ICD-10-CM | POA: Diagnosis not present

## 2017-02-26 DIAGNOSIS — G4489 Other headache syndrome: Secondary | ICD-10-CM

## 2017-02-26 DIAGNOSIS — Z0000 Encounter for general adult medical examination without abnormal findings: Secondary | ICD-10-CM

## 2017-02-26 DIAGNOSIS — J4520 Mild intermittent asthma, uncomplicated: Secondary | ICD-10-CM | POA: Diagnosis not present

## 2017-02-26 LAB — CBC WITH DIFFERENTIAL/PLATELET
BASOS ABS: 0 {cells}/uL (ref 0–200)
Basophils Relative: 0 %
EOS PCT: 2 %
Eosinophils Absolute: 106 cells/uL (ref 15–500)
HEMATOCRIT: 43.6 % (ref 38.5–50.0)
HEMOGLOBIN: 14.7 g/dL (ref 13.2–17.1)
LYMPHS ABS: 2332 {cells}/uL (ref 850–3900)
Lymphocytes Relative: 44 %
MCH: 31.7 pg (ref 27.0–33.0)
MCHC: 33.7 g/dL (ref 32.0–36.0)
MCV: 94.2 fL (ref 80.0–100.0)
MONO ABS: 636 {cells}/uL (ref 200–950)
MPV: 9.3 fL (ref 7.5–12.5)
Monocytes Relative: 12 %
NEUTROS ABS: 2226 {cells}/uL (ref 1500–7800)
Neutrophils Relative %: 42 %
Platelets: 299 10*3/uL (ref 140–400)
RBC: 4.63 MIL/uL (ref 4.20–5.80)
RDW: 13 % (ref 11.0–15.0)
WBC: 5.3 10*3/uL (ref 3.8–10.8)

## 2017-02-26 LAB — TSH: TSH: 1.42 m[IU]/L (ref 0.40–4.50)

## 2017-02-26 MED ORDER — NORTRIPTYLINE HCL 10 MG PO CAPS: 10.0000 mg | ORAL_CAPSULE | Freq: Every day | ORAL | 1 refills | Status: DC

## 2017-02-26 MED ORDER — MELOXICAM 15 MG PO TABS: 15.0000 mg | ORAL_TABLET | Freq: Every day | ORAL | 1 refills | Status: DC

## 2017-02-26 NOTE — Patient Instructions (Signed)
Preventive Care 40-64 Years, Male Preventive care refers to lifestyle choices and visits with your health care provider that can promote health and wellness. What does preventive care include?  A yearly physical exam. This is also called an annual well check.  Dental exams once or twice a year.  Routine eye exams. Ask your health care provider how often you should have your eyes checked.  Personal lifestyle choices, including: ? Daily care of your teeth and gums. ? Regular physical activity. ? Eating a healthy diet. ? Avoiding tobacco and drug use. ? Limiting alcohol use. ? Practicing safe sex. ? Taking low-dose aspirin every day starting at age 48. What happens during an annual well check? The services and screenings done by your health care provider during your annual well check will depend on your age, overall health, lifestyle risk factors, and family history of disease. Counseling Your health care provider may ask you questions about your:  Alcohol use.  Tobacco use.  Drug use.  Emotional well-being.  Home and relationship well-being.  Sexual activity.  Eating habits.  Work and work Statistician.  Screening You may have the following tests or measurements:  Height, weight, and BMI.  Blood pressure.  Lipid and cholesterol levels. These may be checked every 5 years, or more frequently if you are over 33 years old.  Skin check.  Lung cancer screening. You may have this screening every year starting at age 51 if you have a 30-pack-year history of smoking and currently smoke or have quit within the past 15 years.  Fecal occult blood test (FOBT) of the stool. You may have this test every year starting at age 64.  Flexible sigmoidoscopy or colonoscopy. You may have a sigmoidoscopy every 5 years or a colonoscopy every 10 years starting at age 14.  Prostate cancer screening. Recommendations will vary depending on your family history and other risks.  Hepatitis C  blood test.  Hepatitis B blood test.  Sexually transmitted disease (STD) testing.  Diabetes screening. This is done by checking your blood sugar (glucose) after you have not eaten for a while (fasting). You may have this done every 1-3 years.  Discuss your test results, treatment options, and if necessary, the need for more tests with your health care provider. Vaccines Your health care provider may recommend certain vaccines, such as:  Influenza vaccine. This is recommended every year.  Tetanus, diphtheria, and acellular pertussis (Tdap, Td) vaccine. You may need a Td booster every 10 years.  Varicella vaccine. You may need this if you have not been vaccinated.  Zoster vaccine. You may need this after age 18.  Measles, mumps, and rubella (MMR) vaccine. You may need at least one dose of MMR if you were born in 1957 or later. You may also need a second dose.  Pneumococcal 13-valent conjugate (PCV13) vaccine. You may need this if you have certain conditions and have not been vaccinated.  Pneumococcal polysaccharide (PPSV23) vaccine. You may need one or two doses if you smoke cigarettes or if you have certain conditions.  Meningococcal vaccine. You may need this if you have certain conditions.  Hepatitis A vaccine. You may need this if you have certain conditions or if you travel or work in places where you may be exposed to hepatitis A.  Hepatitis B vaccine. You may need this if you have certain conditions or if you travel or work in places where you may be exposed to hepatitis B.  Haemophilus influenzae type b (Hib) vaccine.  You may need this if you have certain risk factors.  Talk to your health care provider about which screenings and vaccines you need and how often you need them. This information is not intended to replace advice given to you by your health care provider. Make sure you discuss any questions you have with your health care provider. Document Released: 07/26/2015  Document Revised: 03/18/2016 Document Reviewed: 04/30/2015 Elsevier Interactive Patient Education  2017 Elsevier Inc.  

## 2017-02-26 NOTE — Progress Notes (Signed)
Name: James Roman   MRN: [id number]    DOB: 1966/06/16   Date:02/26/2017       Progress Note  Subjective  Chief Complaint  Chief Complaint  Patient presents with  . Annual Exam    HPI  Male exam: he is married, same sexual partner, no libido problems or ED. No dysuria. No change in bowel movements or blood in stools.   Mass on left chest wall: likely lipoma, noticed about one month ago, non-tender, and it does not seem to be growing.   Hyperglycemia: last hgbA1C was normal, he denies polyphagia, polydipsia or polyuria  Vitamin D deficiency: he took rx but not currently taking any supplements.   Weight loss: he switched jobs last December, working in a very hot environment, over 100 degrees in [employer]. He sweats all day. He denies any change in appetite.   Hyperlipidemia: not taking statin therapy, we will recheck levels, no chest pain or palpitation  Neck pain: improved since he changed jobs and no longer overusing right arm and shoulder. Still taking Nortriptyline at night and it seems to help with pain. Headaches very seldom. He also takes Meloxicam for inflammation, but advised to try to wean self off  Asthma: doing well, no cough, wheezing or SOB, no longer exposed to strong odors at work  GERD: doing well at this time, no heartburn or reflux at this time  IPSS Questionnaire (AUA-7): Over the past month.   1)  How often have you had a sensation of not emptying your bladder completely after you finish urinating?  0 - Not at all  2)  How often have you had to urinate again less than two hours after you finished urinating? 0 - Not at all  3)  How often have you found you stopped and started again several times when you urinated?  0 - Not at all  4) How difficult have you found it to postpone urination?  0 - Not at all  5) How often have you had a weak urinary stream?  0 - Not at all  6) How often have you had to push or strain to begin urination?  0 - Not at all  7)  How many times did you most typically get up to urinate from the time you went to bed until the time you got up in the morning?  0 - None  Total score:  0-7 mildly symptomatic   8-19 moderately symptomatic   20-35 severely symptomatic    Patient Active Problem List   Diagnosis Date Noted  . Vitamin D deficiency 09/04/2016  . Low serum phosphorus for age 79/23/2018  . Hyperglycemia 04/21/2015  . Hyperlipidemia 04/18/2015  . Asthma, mild intermittent, well-controlled 04/18/2015  . GERD (gastroesophageal reflux disease) 04/18/2015  . Cephalalgia 10/04/2014  . Cervical pain 10/04/2014    History reviewed. No pertinent surgical history.  Family History  Problem Relation Age of Onset  . Cancer Mother   . Hearing loss Father   . Hyperlipidemia Father   . Skin cancer Father        Back  . Cancer Sister        Skin  . Anemia Daughter     Social History   Social History  . Marital status: Married    Spouse name: N/A  . Number of children: N/A  . Years of education: N/A   Occupational History  . Not on file.   Social History Main Topics  . Smoking  status: Never Smoker  . Smokeless tobacco: Never Used  . Alcohol use No  . Drug use: No  . Sexual activity: Yes    Partners: Female   Other Topics Concern  . Not on file   Social History Narrative   Married and has one step-daughter at home (  33 yo )   He has a daughter that was given for adoption as infant, he knows where she lives but is not involved in her life.    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  aspirin 81 MG EC tablet, TAKE 1 TABLET (81 MG TOTAL) BY MOUTH DAILY., Disp: 100 tablet, Rfl: 0 .  meloxicam (MOBIC) 15 MG tablet, Take 1 tablet (15 mg total) by mouth daily., Disp: 90 tablet, Rfl: 1 .  nortriptyline (PAMELOR) 10 MG capsule, Take 1 capsule (10 mg total) by mouth at bedtime.,  Disp: 90 capsule, Rfl: 1  Allergies  Allergen Reactions  . Penicillin G Other (See Comments)    Gets sick-vomiting     ROS  Constitutional: Negative for fever, positive for weight change.  Respiratory: Negative for cough and shortness of breath.   Cardiovascular: Negative for chest pain or palpitations.  Gastrointestinal: Negative for abdominal pain, no bowel changes.  Musculoskeletal: Negative for gait problem or joint swelling.  Skin: Negative for rash.  Neurological: Negative for dizziness, positive for intermittent  headache.  No other specific complaints in a complete review of systems (except as listed in HPI above).  Objective  Vitals:   02/26/17 1346  BP: 110/72  Pulse: 63  Resp: 16  Temp: 98.4 F (36.9 C)  TempSrc: Oral  SpO2: 99%  Weight: 136 lb 6.4 oz (61.9 kg)  Height: 5\' 4"  (1.626 m)    Body mass index is 23.41 kg/m.  Physical Exam  Constitutional: Patient appears well-developed and well-nourished. No distress.  HENT: Head: Normocephalic and atraumatic. Ears: B TMs ok, no erythema or effusion; Nose: Nose normal. Mouth/Throat: Oropharynx is clear and moist. No oropharyngeal exudate.  Eyes: Conjunctivae and EOM are normal. Pupils are equal, round, and reactive to light. No scleral icterus.  Neck: Normal range of motion. Neck supple. No JVD present. No thyromegaly present.  Cardiovascular: Normal rate, regular rhythm and normal heart sounds.  No murmur heard. No BLE edema. Pulmonary/Chest: Effort normal and breath sounds normal. No respiratory distress. Abdominal: Soft. Bowel sounds are normal, no distension. There is no tenderness. no masses MALE GENITALIA: Normal descended testes bilaterally, no masses palpated, no hernias, no lesions, no discharge RECTAL: Prostate normal size and consistency, no rectal masses or hemorrhoids Musculoskeletal: Normal range of motion, no joint effusions. No gross deformities Neurological: he is alert and oriented to person,  place, and time. No cranial nerve deficit. Coordination, balance, strength, speech and gait are normal.  Skin: Skin is warm and dry. No rash noted. No erythema. He has a soft mass on mid to proximal left axillary line, leveled with left nipple Psychiatric: Patient has a normal mood and affect. behavior is normal. Judgment and thought content normal.  PHQ2/9: Depression screen [facility] 2/9 02/26/2017 01/22/2016 07/24/2015 04/18/2015  Decreased Interest 0 0 0 0  Down, Depressed, Hopeless 0 0 0 0  PHQ - 2 Score 0 0 0 0     Fall Risk: Fall Risk  02/26/2017 01/22/2016 07/24/2015 04/18/2015  Falls  in the past year? No No No No    Functional Status Survey: Is the patient deaf or have difficulty hearing?: No Does the patient have difficulty seeing, even when wearing glasses/contacts?: No Does the patient have difficulty concentrating, remembering, or making decisions?: No Does the patient have difficulty walking or climbing stairs?: No Does the patient have difficulty dressing or bathing?: No Does the patient have difficulty doing errands alone such as visiting a doctor's office or shopping?: No    Assessment & Plan  1. Encounter for routine history and physical exam for male  Discussed importance of 150 minutes of physical activity weekly, eat two servings of fish weekly, eat one serving of tree nuts ( cashews, pistachios, pecans, almonds.Marland Kitchen) every other day, eat 6 servings of fruit/vegetables daily and drink plenty of water and avoid sweet beverages.  - Hemoglobin A1c - Lipid panel - COMPLETE METABOLIC PANEL WITH GFR - CBC with Differential/Platelet - VITAMIN D 25 Hydroxy (Vit-D Deficiency, Fractures)  2. Other headache syndrome  - nortriptyline (PAMELOR) 10 MG capsule; Take 1 capsule (10 mg total) by mouth at bedtime.  Dispense: 90 capsule; Refill: 1  3. Cervical pain  - meloxicam (MOBIC) 15 MG tablet; Take 1 tablet (15 mg total) by mouth daily.  Dispense: 90 tablet; Refill: 1  4. Weight  loss  He thinks secondary to change in his job, working in the heat  - TSH - C-reactive protein   5. Asthma, mild intermittent, well-controlled  Doing well   6. Gastroesophageal reflux disease without esophagitis  Off medication   7. Hyperglycemia  Recheck labs   8. Lipoma of torso  Advised to monitor and refer to surgeon if changes

## 2017-02-27 LAB — LIPID PANEL
CHOL/HDL RATIO: 5 ratio — AB (ref <5.0)
Cholesterol: 229 mg/dL — ABNORMAL HIGH (ref <200)
HDL: 46 mg/dL (ref >40)
LDL CALC: 165 mg/dL — AB (ref <100)
Triglycerides: 90 mg/dL (ref <150)
VLDL: 18 mg/dL (ref <30)

## 2017-02-27 LAB — COMPLETE METABOLIC PANEL WITH GFR
ALBUMIN: 4.4 g/dL (ref 3.6–5.1)
ALT: 16 U/L (ref 9–46)
AST: 15 U/L (ref 10–35)
Alkaline Phosphatase: 51 U/L (ref 40–115)
BUN: 25 mg/dL (ref 7–25)
CHLORIDE: 105 mmol/L (ref 98–110)
CO2: 22 mmol/L (ref 20–32)
Calcium: 9.3 mg/dL (ref 8.6–10.3)
Creat: 1.04 mg/dL (ref 0.70–1.33)
GFR, Est African American: >89 mL/min (ref >=60)
GFR, Est Non African American: 82 mL/min (ref >=60)
GLUCOSE: 85 mg/dL (ref 65–99)
POTASSIUM: 5.2 mmol/L (ref 3.5–5.3)
SODIUM: 138 mmol/L (ref 135–146)
Total Bilirubin: 0.6 mg/dL (ref 0.2–1.2)
Total Protein: 6.5 g/dL (ref 6.1–8.1)

## 2017-02-27 LAB — VITAMIN D 25 HYDROXY (VIT D DEFICIENCY, FRACTURES): Vit D, 25-Hydroxy: 31 ng/mL (ref 30–100)

## 2017-02-27 LAB — HEMOGLOBIN A1C
Hgb A1c MFr Bld: 5.2 % (ref <5.7)
Mean Plasma Glucose: 103 mg/dL

## 2017-03-01 LAB — C-REACTIVE PROTEIN: CRP: 0.4 mg/L (ref <8.0)

## 2017-03-21 ENCOUNTER — Other Ambulatory Visit: Payer: Self-pay | Admitting: Family Medicine

## 2017-03-21 DIAGNOSIS — G4489 Other headache syndrome: Secondary | ICD-10-CM

## 2017-03-28 ENCOUNTER — Other Ambulatory Visit: Payer: Self-pay | Admitting: Family Medicine

## 2017-03-28 DIAGNOSIS — M542 Cervicalgia: Secondary | ICD-10-CM

## 2017-03-29 NOTE — Telephone Encounter (Signed)
Patient requesting refill of Meloxicam to [facility],.

## 2017-08-20 ENCOUNTER — Other Ambulatory Visit: Payer: Self-pay | Admitting: Family Medicine

## 2017-08-20 DIAGNOSIS — G4489 Other headache syndrome: Secondary | ICD-10-CM

## 2017-08-20 NOTE — Telephone Encounter (Signed)
Refill request for general medication. Nortriptyline to [facility].   Last office visit: 02/26/2017   Follow up on 08/30/2017

## 2017-08-30 ENCOUNTER — Ambulatory Visit (INDEPENDENT_AMBULATORY_CARE_PROVIDER_SITE_OTHER): Payer: 59 | Admitting: Family Medicine

## 2017-08-30 ENCOUNTER — Encounter: Payer: Self-pay | Admitting: Family Medicine

## 2017-08-30 VITALS — BP 112/64 | HR 66 | Temp 98.3°F | Resp 16 | Ht 64.0 in | Wt 141.9 lb

## 2017-08-30 DIAGNOSIS — R739 Hyperglycemia, unspecified: Secondary | ICD-10-CM

## 2017-08-30 DIAGNOSIS — J4520 Mild intermittent asthma, uncomplicated: Secondary | ICD-10-CM

## 2017-08-30 DIAGNOSIS — Z23 Encounter for immunization: Secondary | ICD-10-CM | POA: Diagnosis not present

## 2017-08-30 DIAGNOSIS — M542 Cervicalgia: Secondary | ICD-10-CM

## 2017-08-30 DIAGNOSIS — K219 Gastro-esophageal reflux disease without esophagitis: Secondary | ICD-10-CM

## 2017-08-30 DIAGNOSIS — G4489 Other headache syndrome: Secondary | ICD-10-CM

## 2017-08-30 DIAGNOSIS — E7800 Pure hypercholesterolemia, unspecified: Secondary | ICD-10-CM

## 2017-08-30 DIAGNOSIS — R202 Paresthesia of skin: Secondary | ICD-10-CM

## 2017-08-30 MED ORDER — MELOXICAM 15 MG PO TABS: 15.0000 mg | ORAL_TABLET | Freq: Every day | ORAL | 1 refills | Status: DC

## 2017-08-30 NOTE — Progress Notes (Signed)
Name: James Roman   MRN: [id number]    DOB: [birth year]   Date:08/30/2017       Progress Note  Subjective  Chief Complaint  Chief Complaint  Patient presents with  . Medication Refill    6 month F/U  . Headache    Takes Nortripyline nightly and has headaches once month.   . Cervical Pain    Meloxicam helps control pain and with being able to sleep  . Hyperlipidemia    HPI   Hyperglycemia: last hgbA1C was normal, he denies polyphagia, polydipsia or polyuria  Vitamin D deficiency: he took rx but not currently taking any supplements, last vitamin D was back normal.    Hyperlipidemia: not taking statin therapy, last LDL was high but low ASCVD,no chest pain or palpitation.  Neck pain: improved since he changed jobs and no longer overusing right arm and shoulder. Still taking Nortriptyline at night and it seems to help with pain. Headaches resolved, and he wants to try weaning off Nortriptyline  He also takes Meloxicam for inflammation, but advised to only take it prn because of risk of long term use of NSAID's   Asthma: doing well, no cough, wheezing or SOB, no longer exposed to strong odors at work.   GERD: doing well at this time, no heartburn or reflux at this time  Paresthesia: he states that over the past 2 months he has noticed paresthesia about two nights a week on right arm, either wrist or right elbow, wakes him up at night and has to shake his hand. No weakness or tingling during the day. He uses right arm a lot at work, repetitive motion  Patient Active Problem List   Diagnosis Date Noted  . Vitamin D deficiency 09/04/2016  . Low serum phosphorus for age 36/23/2018  . Hyperglycemia 04/21/2015  . Hyperlipidemia 04/18/2015  . Asthma, mild intermittent, well-controlled 04/18/2015  . GERD (gastroesophageal reflux disease) 04/18/2015  . Cephalalgia 10/04/2014  . Cervical pain 10/04/2014    History reviewed. No pertinent surgical history.  Family History   Problem Relation Age of Onset  . Cancer Mother   . Hearing loss Father   . Hyperlipidemia Father   . Skin cancer Father        Back  . Cancer Sister        Skin  . Anemia Daughter     Social History   Socioeconomic History  . Marital status: Married    Spouse name: Not on file  . Number of children: Not on file  . Years of education: Not on file  . Highest education level: Not on file  Social Needs  . Financial resource strain: Not on file  . Food insecurity - worry: Not on file  . Food insecurity - inability: Not on file  . Transportation needs - medical: Not on file  . Transportation needs - non-medical: Not on file  Occupational History  . Not on file  Tobacco Use  . Smoking status: Never Smoker  . Smokeless tobacco: Never Used  Substance and Sexual Activity  . Alcohol use: No    Alcohol/week: 0.0 oz  . Drug use: No  . Sexual activity: Yes    Partners: Female  Other Topics Concern  . Not on file  Social History Narrative   Married and has one step-daughter at home (  16 yo )   He has a daughter that was given for adoption as infant, he knows where she lives but is not  involved in her life.    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  aspirin 81 MG EC tablet, TAKE 1 TABLET (81 MG TOTAL) BY MOUTH DAILY., Disp: 100 tablet, Rfl: 0 .  meloxicam (MOBIC) 15 MG tablet, Take 1 tablet (15 mg total) by mouth daily., Disp: 90 tablet, Rfl: 1 .  nortriptyline (PAMELOR) 10 MG capsule, TAKE ONE CAPSULE BY MOUTH AT BEDTIME, Disp: 90 capsule, Rfl: 0  Allergies  Allergen Reactions  . Penicillin G Other (See Comments)    Gets sick-vomiting     ROS  Constitutional: Negative for fever or weight change.  Respiratory: Negative for cough and shortness of breath.   Cardiovascular: Negative for chest pain or palpitations.  Gastrointestinal: Negative for  abdominal pain, no bowel changes.  Musculoskeletal: Negative for gait problem or joint swelling.  Skin: Negative for rash.  Neurological: Negative for dizziness or headache.  No other specific complaints in a complete review of systems (except as listed in HPI above).  Objective  Vitals:   08/30/17 0916  BP: 112/64  Pulse: 66  Resp: 16  Temp: 98.3 F (36.8 C)  TempSrc: Oral  SpO2: 96%  Weight: 141 lb 14.4 oz (64.4 kg)  Height: 5\' 4"  (1.626 m)    Body mass index is 24.36 kg/m.  Physical Exam  Constitutional: Patient appears well-developed and well-nourished. No distress.  HEENT: head atraumatic, normocephalic, pupils equal and reactive to light,  neck supple, throat within normal limits Cardiovascular: Normal rate, regular rhythm and normal heart sounds.  No murmur heard. No BLE edema. Pulmonary/Chest: Effort normal and breath sounds normal. No respiratory distress. Abdominal: Soft.  There is no tenderness. Neurological: phalen's and tinnel's negative, normal grip, normal upper extremity sensation, normal rom of neck and no point tenderness No hand atrophy  Psychiatric: Patient has a normal mood and affect. behavior is normal. Judgment and thought content normal.  PHQ2/9: Depression screen [facility] 2/9 08/30/2017 02/26/2017 01/22/2016 07/24/2015 04/18/2015  Decreased Interest 0 0 0 0 0  Down, Depressed, Hopeless 0 0 0 0 0  PHQ - 2 Score 0 0 0 0 0     Fall Risk: Fall Risk  08/30/2017 02/26/2017 01/22/2016 07/24/2015 04/18/2015  Falls in the past year? No No No No No     Functional Status Survey: Is the patient deaf or have difficulty hearing?: No Does the patient have difficulty seeing, even when wearing glasses/contacts?: No Does the patient have difficulty concentrating, remembering, or making decisions?: No Does the patient have difficulty walking or climbing stairs?: No Does the patient have difficulty dressing or bathing?: No Does the patient have difficulty doing errands  alone such as visiting a doctor's office or shopping?: No   Assessment & Plan   1. Asthma, mild intermittent, well-controlled  Under control   2. Need for immunization against influenza  Refused   3. Cervical pain  - meloxicam (MOBIC) 15 MG tablet; Take 1 tablet (15 mg total) by mouth daily.  Dispense: 90 tablet; Refill: 1 Discussed long term use of nsaid's, he states it helps with aches and pains. He has some tingling of right arm at night, discussed EMG and he wants to hold off for now  4. Hyperglycemia  Recheck next visit   5. Pure hypercholesterolemia  He states that he had labs done at work and LDL was elevated,  explained ASCVD was low we will recheck next visit before starting statin therapy  6. Gastroesophageal reflux disease without esophagitis  Under control   7. Other headache syndrome  Resolved, he wants to hold off on taking Nortriptyline, explained he can try   8. Paresthesia of right arm  He wants to hold off on EMG at this time

## 2017-11-14 ENCOUNTER — Other Ambulatory Visit: Payer: Self-pay | Admitting: Family Medicine

## 2017-11-14 DIAGNOSIS — G4489 Other headache syndrome: Secondary | ICD-10-CM

## 2018-02-05 ENCOUNTER — Other Ambulatory Visit: Payer: Self-pay | Admitting: Family Medicine

## 2018-02-05 DIAGNOSIS — G4489 Other headache syndrome: Secondary | ICD-10-CM

## 2018-03-03 ENCOUNTER — Encounter: Payer: Self-pay | Admitting: Family Medicine

## 2018-03-03 ENCOUNTER — Ambulatory Visit (INDEPENDENT_AMBULATORY_CARE_PROVIDER_SITE_OTHER): Payer: 59 | Admitting: Family Medicine

## 2018-03-03 VITALS — BP 118/72 | HR 78 | Temp 98.3°F | Resp 16 | Ht 64.0 in | Wt 139.8 lb

## 2018-03-03 DIAGNOSIS — K219 Gastro-esophageal reflux disease without esophagitis: Secondary | ICD-10-CM

## 2018-03-03 DIAGNOSIS — Z130 Encounter for screening for diseases of the blood and blood-forming organs and certain disorders involving the immune mechanism: Secondary | ICD-10-CM

## 2018-03-03 DIAGNOSIS — D171 Benign lipomatous neoplasm of skin and subcutaneous tissue of trunk: Secondary | ICD-10-CM

## 2018-03-03 DIAGNOSIS — M542 Cervicalgia: Secondary | ICD-10-CM | POA: Diagnosis not present

## 2018-03-03 DIAGNOSIS — R739 Hyperglycemia, unspecified: Secondary | ICD-10-CM

## 2018-03-03 DIAGNOSIS — E559 Vitamin D deficiency, unspecified: Secondary | ICD-10-CM

## 2018-03-03 DIAGNOSIS — Z0000 Encounter for general adult medical examination without abnormal findings: Secondary | ICD-10-CM | POA: Diagnosis not present

## 2018-03-03 DIAGNOSIS — Z23 Encounter for immunization: Secondary | ICD-10-CM

## 2018-03-03 DIAGNOSIS — J4520 Mild intermittent asthma, uncomplicated: Secondary | ICD-10-CM | POA: Diagnosis not present

## 2018-03-03 DIAGNOSIS — E7800 Pure hypercholesterolemia, unspecified: Secondary | ICD-10-CM

## 2018-03-03 DIAGNOSIS — R202 Paresthesia of skin: Secondary | ICD-10-CM

## 2018-03-03 DIAGNOSIS — G4489 Other headache syndrome: Secondary | ICD-10-CM

## 2018-03-03 MED ORDER — NORTRIPTYLINE HCL 25 MG PO CAPS: 10.0000 mg | ORAL_CAPSULE | Freq: Every day | ORAL | 1 refills | Status: DC

## 2018-03-03 MED ORDER — MELOXICAM 15 MG PO TABS: 15.0000 mg | ORAL_TABLET | Freq: Every day | ORAL | 1 refills | Status: DC

## 2018-03-03 NOTE — Patient Instructions (Signed)

## 2018-03-03 NOTE — Progress Notes (Signed)
Name: James Roman   MRN: [id number]    DOB: [birth year]   Date:03/03/2018       Progress Note  Subjective  Chief Complaint  Chief Complaint  Patient presents with  . Annual Exam    patient sleeps about 6.5 - 8 hrs per night. patient eats but it may not be consider a well balanced  . Labs Only  . Immunizations    declines influenza    HPI  Patient presents for annual CPE and follow up.  Hyperglycemia: last hgbA1C was normal, previously up to 5.8%, he denies polyphagia, polydipsia or polyuria  Vitamin D deficiency: not urrently taking any supplements, last vitamin D was back normal.    Hyperlipidemia: not taking statin therapy, last LDL was high but low ASCVD,no chest pain or palpitation, normal bp   Neck pain: improved since he changed jobs and no longer overusing right arm and shoulder. He is back on Nortriptyline taking 20 mg at night because it helps with headaches ( headaches have been present past 2 weeks because of change in rx glasses but usually well controlled with medication). No longer has tingling or numbness on hands, states joints feels much better with meloxicam  Asthma: doing well, no cough, wheezing or SOB, no longer exposed to strong odors at work. Unchanged.   GERD: doing well at this time, no heartburn or reflux at this time, he has occasional regurgitation and takes otc medication seldom when he splurges    USPSTF grade A and B recommendations:  Diet: eats out a lot and frozen meals. Not enough fruit and vegetables.    Depression:  Depression screen [facility] 2/9 03/03/2018 08/30/2017 02/26/2017 01/22/2016 07/24/2015  Decreased Interest 0 0 0 0 0  Down, Depressed, Hopeless 0 0 0 0 0  PHQ - 2 Score 0 0 0 0 0  Altered sleeping 0 - - - -  Tired, decreased energy 0 - - - -  Change in appetite 0 - - - -  Feeling bad or failure about yourself  0 - - - -  Trouble concentrating 0 - - - -  Moving slowly or fidgety/restless 0 - - - -  Suicidal thoughts 0 - - -  -  PHQ-9 Score 0 - - - -  Difficult doing work/chores Not difficult at all - - - -    Hypertension:  BP Readings from Last 3 Encounters:  03/03/18 118/72  08/30/17 112/64  02/26/17 110/72    Obesity: Wt Readings from Last 3 Encounters:  03/03/18 139 lb 12.8 oz (63.4 kg)  08/30/17 141 lb 14.4 oz (64.4 kg)  02/26/17 136 lb 6.4 oz (61.9 kg)   BMI Readings from Last 3 Encounters:  03/03/18 24.00 kg/m  08/30/17 24.36 kg/m  02/26/17 23.41 kg/m     Lipids:  Lab Results  Component Value Date   CHOL 229 (H) 02/26/2017   CHOL 209 (A) 08/27/2016   CHOL 168 01/22/2016   Lab Results  Component Value Date   HDL 46 02/26/2017   HDL 41 08/27/2016   HDL 44 01/22/2016   Lab Results  Component Value Date   LDLCALC 165 (H) 02/26/2017   LDLCALC 144 08/27/2016   LDLCALC 106 01/22/2016   Lab Results  Component Value Date   TRIG 90 02/26/2017   TRIG 118 08/27/2016   TRIG 90 01/22/2016   Lab Results  Component Value Date   CHOLHDL 5.0 (H) 02/26/2017   CHOLHDL 3.8 01/22/2016   CHOLHDL 7.1 (H) 04/18/2015  No results found for: LDLDIRECT Glucose:  Glucose  Date Value Ref Range Status  04/18/2015 103 (H) 65 - 99 mg/dL Final    Comment:    Specimen received in contact with cells. No visible hemolysis present. However GLUC may be decreased and K increased. Clinical correlation indicated.   04/13/2012 135 (H) 65 - 99 mg/dL Final   Glucose, Bld  Date Value Ref Range Status  02/26/2017 85 65 - 99 mg/dL Final  01/22/2016 92 65 - 99 mg/dL Final      Office Visit from 03/03/2018 in [facility]  AUDIT-C Score  0       Married STD testing and prevention (HIV/chl/gon/syphilis):  Hep C: up to date   Skin cancer: no atypical lesions  Colorectal cancer: up to date.  Prostate cancer: discussed USPTF   Lab Results  Component Value Date   PSA 1.0 08/27/2016   PSA 1.1 normal 04/19/2014    IPSS Questionnaire (AUA-7): Over the past month.   1)  How  often have you had a sensation of not emptying your bladder completely after you finish urinating?  0 - Not at all  2)  How often have you had to urinate again less than two hours after you finished urinating? 0 - Not at all  3)  How often have you found you stopped and started again several times when you urinated?  0 - Not at all  4) How difficult have you found it to postpone urination?  0 - Not at all  5) How often have you had a weak urinary stream?  0 - Not at all  6) How often have you had to push or strain to begin urination?  0 - Not at all  7) How many times did you most typically get up to urinate from the time you went to bed until the time you got up in the morning?  1 - 1 time  Total score:  0-7 mildly symptomatic   8-19 moderately symptomatic   20-35 severely symptomatic   Lung cancer:   Low Dose CT Chest recommended if Age 20-80 years, 30 pack-year currently smoking OR have quit w/in 15years. Patient does not qualify.   AAA:  The USPSTF recommends one-time screening with ultrasonography in men ages 45 to 15 years who have ever smoked. Not indicated  ECG:  She had one done   Advanced Care Planning: A voluntary discussion about advance care planning including the explanation and discussion of advance directives.  Discussed health care proxy and Living will, and the patient was able to identify a health care proxy as wife .  Patient does not have a living will at present time. If patient does have living will, I have requested they bring this to the clinic to be scanned in to their chart.  Patient Active Problem List   Diagnosis Date Noted  . Vitamin D deficiency 09/04/2016  . Low serum phosphorus for age 51/23/2018  . Hyperglycemia 04/21/2015  . Hyperlipidemia 04/18/2015  . Asthma, mild intermittent, well-controlled 04/18/2015  . GERD (gastroesophageal reflux disease) 04/18/2015  . Cephalalgia 10/04/2014  . Cervical pain 10/04/2014    History reviewed. No pertinent surgical  history.  Family History  Problem Relation Age of Onset  . Cancer Mother   . Hearing loss Father   . Hyperlipidemia Father   . Skin cancer Father        Back  . Cancer Sister  Skin  . Anemia Daughter     Social History   Socioeconomic History  . Marital status: Married    Spouse name: Manuela Schwartz  . Number of children: 2  . Years of education: Not on file  . Highest education level: High school graduate  Occupational History  . Not on file  Social Needs  . Financial resource strain: Somewhat hard  . Food insecurity:    Worry: Never true    Inability: Never true  . Transportation needs:    Medical: No    Non-medical: No  Tobacco Use  . Smoking status: Never Smoker  . Smokeless tobacco: Never Used  Substance and Sexual Activity  . Alcohol use: No    Alcohol/week: 0.0 standard drinks  . Drug use: No  . Sexual activity: Yes    Partners: Female  Lifestyle  . Physical activity:    Days per week: 6 days    Minutes per session: 150+ min  . Stress: To some extent  Relationships  . Social connections:    Talks on phone: More than three times a week    Gets together: More than three times a week    Attends religious service: Never    Active member of club or organization: No    Attends meetings of clubs or organizations: Never    Relationship status: Married  . Intimate partner violence:    Fear of current or ex partner: No    Emotionally abused: No    Physically abused: No    Forced sexual activity: No  Other Topics Concern  . 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  meloxicam (MOBIC) 15 MG tablet,  Take 1 tablet (15 mg total) by mouth daily., Disp: 90 tablet, Rfl: 1 .  nortriptyline (PAMELOR) 25 MG capsule, Take 1 capsule (25 mg total) by mouth at bedtime., Disp: 90 capsule, Rfl: 1  Allergies  Allergen Reactions  . Penicillin G Other (See Comments)    Gets sick-vomiting     ROS  Constitutional: Negative for fever or weight change.  Respiratory: Negative for cough and shortness of breath.   Cardiovascular: Negative for chest pain or palpitations.  Gastrointestinal: Negative for abdominal pain, no bowel changes.  Musculoskeletal: Negative for gait problem or joint swelling.  Skin: Negative for rash.  Neurological: Negative for dizziness , positive for intermittent headache.  No other specific complaints in a complete review of systems (except as listed in HPI above).  Objective  Vitals:   03/03/18 0804  BP: 118/72  Pulse: 78  Resp: 16  Temp: 98.3 F (36.8 C)  TempSrc: Oral  SpO2: 97%  Weight: 139 lb 12.8 oz (63.4 kg)  Height: 5\' 4"  (1.626 m)    Body mass index is 24 kg/m.  Physical Exam  Constitutional: Patient appears well-developed and well-nourished. No distress.  HENT: Head: Normocephalic and atraumatic. Ears: B TMs ok, no erythema or effusion; Nose: Nose normal. Mouth/Throat: Oropharynx is clear and moist. No oropharyngeal exudate.  Eyes: Conjunctivae and EOM  are normal. Pupils are equal, round, and reactive to light. No scleral icterus.  Neck: Normal range of motion. Neck supple. No JVD present. No thyromegaly present.  Cardiovascular: Normal rate, regular rhythm and normal heart sounds.  No murmur heard. No BLE edema. Pulmonary/Chest: Effort normal and breath sounds normal. No respiratory distress. Abdominal: Soft. Bowel sounds are normal, no distension. There is no tenderness. no masses MALE GENITALIA: Normal descended testes bilaterally, no masses palpated, no hernias, no lesions, no discharge RECTAL: rectal exam not done  Musculoskeletal: Normal range  of motion, no joint effusions. No gross deformities Neurological: he is alert and oriented to person, place, and time. No cranial nerve deficit. Coordination, balance, strength, speech and gait are normal.  Skin: Skin is warm and dry. No rash noted. No erythema. Lipoma left side of thorax , tattoos  Psychiatric: Patient has a normal mood and affect. behavior is normal. Judgment and thought content normal.  PHQ2/9: Depression screen [facility] 2/9 03/03/2018 08/30/2017 02/26/2017 01/22/2016 07/24/2015  Decreased Interest 0 0 0 0 0  Down, Depressed, Hopeless 0 0 0 0 0  PHQ - 2 Score 0 0 0 0 0  Altered sleeping 0 - - - -  Tired, decreased energy 0 - - - -  Change in appetite 0 - - - -  Feeling bad or failure about yourself  0 - - - -  Trouble concentrating 0 - - - -  Moving slowly or fidgety/restless 0 - - - -  Suicidal thoughts 0 - - - -  PHQ-9 Score 0 - - - -  Difficult doing work/chores Not difficult at all - - - -    Fall Risk: Fall Risk  03/03/2018 08/30/2017 02/26/2017 01/22/2016 07/24/2015  Falls in the past year? No No No No No    Functional Status Survey: Is the patient deaf or have difficulty hearing?: No Does the patient have difficulty seeing, even when wearing glasses/contacts?: No Does the patient have difficulty concentrating, remembering, or making decisions?: No Does the patient have difficulty walking or climbing stairs?: No Does the patient have difficulty dressing or bathing?: No Does the patient have difficulty doing errands alone such as visiting a doctor's office or shopping?: No    Assessment & Plan  1. Encounter for routine history and physical exam for male  - COMPLETE METABOLIC PANEL WITH GFR - VITAMIN D 25 Hydroxy (Vit-D Deficiency, Fractures) - Lipid panel - Hemoglobin A1c - CBC with Differential/Platelet  2. Cervical pain  - meloxicam (MOBIC) 15 MG tablet; Take 1 tablet (15 mg total) by mouth daily.  Dispense: 90 tablet; Refill: 1  3. Gastroesophageal  reflux disease without esophagitis  He has occasional regurgitation, not frequent, taking otc medication prn   4. Other headache syndrome  Taking 20 mg currently , advised to go up on Pamelor to 25 mg at night  - nortriptyline (PAMELOR) 25 MG capsule; Take 1 capsule (25 mg total) by mouth at bedtime.  Dispense: 90 capsule; Refill: 1  5. Lipoma of torso  Stable   6. Paresthesia of right arm  Resolved   7. Pure hypercholesterolemia  - Lipid panel  8. Hyperglycemia  - Hemoglobin A1c  9. Asthma, mild intermittent, well-controlled  Doing well at this time  10. Vitamin D deficiency  - VITAMIN D 25 Hydroxy (Vit-D Deficiency, Fractures)  11. Screening for deficiency anemia  - CBC with Differential/Platelet  12. Need for shingles vaccine  refused  13. Needs flu shot  refused -Prostate cancer screening  and PSA options (with potential risks and benefits of testing vs not testing) were discussed along with recent recs/guidelines. -USPSTF grade A and B recommendations reviewed with patient; age-appropriate recommendations, preventive care, screening tests, etc discussed and encouraged; healthy living encouraged; see AVS for patient education given to patient -Discussed importance of 150 minutes of physical activity weekly, eat two servings of fish weekly, eat one serving of tree nuts ( cashews, pistachios, pecans, almonds.Marland Kitchen) every other day, eat 6 servings of fruit/vegetables daily and drink plenty of water and avoid sweet beverages.

## 2018-03-04 LAB — COMPLETE METABOLIC PANEL WITH GFR
AG RATIO: 2.1 (calc) (ref 1.0–2.5)
ALKALINE PHOSPHATASE (APISO): 58 U/L (ref 40–115)
ALT: 18 U/L (ref 9–46)
AST: 17 U/L (ref 10–35)
Albumin: 4.4 g/dL (ref 3.6–5.1)
BILIRUBIN TOTAL: 0.4 mg/dL (ref 0.2–1.2)
BUN: 24 mg/dL (ref 7–25)
CHLORIDE: 105 mmol/L (ref 98–110)
CO2: 28 mmol/L (ref 20–32)
Calcium: 9 mg/dL (ref 8.6–10.3)
Creat: 1.16 mg/dL (ref 0.70–1.33)
GFR, EST AFRICAN AMERICAN: 82 mL/min/{1.73_m2} (ref >=60)
GFR, Est Non African American: 71 mL/min/{1.73_m2} (ref >=60)
Globulin: 2.1 g/dL (calc) (ref 1.9–3.7)
Glucose, Bld: 91 mg/dL (ref 65–139)
POTASSIUM: 4.8 mmol/L (ref 3.5–5.3)
Sodium: 139 mmol/L (ref 135–146)
TOTAL PROTEIN: 6.5 g/dL (ref 6.1–8.1)

## 2018-03-04 LAB — CBC WITH DIFFERENTIAL/PLATELET
Basophils Absolute: 31 cells/uL (ref 0–200)
Basophils Relative: 0.6 %
EOS PCT: 2 %
Eosinophils Absolute: 102 cells/uL (ref 15–500)
HEMATOCRIT: 39.5 % (ref 38.5–50.0)
Hemoglobin: 13.9 g/dL (ref 13.2–17.1)
LYMPHS ABS: 1862 {cells}/uL (ref 850–3900)
MCH: 32.3 pg (ref 27.0–33.0)
MCHC: 35.2 g/dL (ref 32.0–36.0)
MCV: 91.9 fL (ref 80.0–100.0)
MONOS PCT: 9.6 %
MPV: 9.6 fL (ref 7.5–12.5)
NEUTROS PCT: 51.3 %
Neutro Abs: 2616 cells/uL (ref 1500–7800)
Platelets: 319 10*3/uL (ref 140–400)
RBC: 4.3 10*6/uL (ref 4.20–5.80)
RDW: 12.1 % (ref 11.0–15.0)
Total Lymphocyte: 36.5 %
WBC mixed population: 490 cells/uL (ref 200–950)
WBC: 5.1 10*3/uL (ref 3.8–10.8)

## 2018-03-04 LAB — LIPID PANEL
Cholesterol: 218 mg/dL — ABNORMAL HIGH (ref <200)
HDL: 41 mg/dL (ref >40)
LDL Cholesterol (Calc): 155 mg/dL (calc) — ABNORMAL HIGH
NON-HDL CHOLESTEROL (CALC): 177 mg/dL — AB (ref <130)
Total CHOL/HDL Ratio: 5.3 (calc) — ABNORMAL HIGH (ref <5.0)
Triglycerides: 108 mg/dL (ref <150)

## 2018-03-04 LAB — HEMOGLOBIN A1C
EAG (MMOL/L): 5.8 (calc)
Hgb A1c MFr Bld: 5.3 % of total Hgb (ref <5.7)
Mean Plasma Glucose: 105 (calc)

## 2018-03-04 LAB — VITAMIN D 25 HYDROXY (VIT D DEFICIENCY, FRACTURES): Vit D, 25-Hydroxy: 30 ng/mL (ref 30–100)

## 2018-03-09 ENCOUNTER — Other Ambulatory Visit: Payer: Self-pay | Admitting: Family Medicine

## 2018-03-09 DIAGNOSIS — G4489 Other headache syndrome: Secondary | ICD-10-CM

## 2018-08-19 LAB — BASIC METABOLIC PANEL
BUN: 21 (ref 4–21)
Creatinine: 1.1 (ref 0.6–1.3)
GLUCOSE: 87
Potassium: 4.8 (ref 3.4–5.3)
SODIUM: 134 — AB (ref 137–147)

## 2018-08-19 LAB — CBC AND DIFFERENTIAL
HEMATOCRIT: 45 (ref 41–53)
HEMOGLOBIN: 15.3 (ref 13.5–17.5)
Platelets: 323 (ref 150–399)
WBC: 6.7

## 2018-08-19 LAB — HEPATIC FUNCTION PANEL
ALT: 26 (ref 10–40)
AST: 16 (ref 14–40)
Alkaline Phosphatase: 71 (ref 25–125)

## 2018-08-19 LAB — LIPID PANEL
Cholesterol: 229 — AB (ref 0–200)
HDL: 49 (ref 35–70)
LDL CALC: 149
Triglycerides: 163 — AB (ref 40–160)

## 2018-08-19 LAB — HEMOGLOBIN A1C: Hemoglobin A1C: 5.6

## 2018-08-19 LAB — PSA: PSA: 0.69

## 2018-09-04 ENCOUNTER — Other Ambulatory Visit: Payer: Self-pay | Admitting: Family Medicine

## 2018-09-04 DIAGNOSIS — G4489 Other headache syndrome: Secondary | ICD-10-CM

## 2018-09-05 ENCOUNTER — Ambulatory Visit: Payer: 59 | Admitting: Family Medicine

## 2018-09-19 ENCOUNTER — Ambulatory Visit (INDEPENDENT_AMBULATORY_CARE_PROVIDER_SITE_OTHER): Payer: 59 | Admitting: Family Medicine

## 2018-09-19 ENCOUNTER — Encounter: Payer: Self-pay | Admitting: Family Medicine

## 2018-09-19 VITALS — BP 140/80 | HR 90 | Temp 98.2°F | Resp 16 | Ht 64.0 in | Wt 142.8 lb

## 2018-09-19 DIAGNOSIS — M25561 Pain in right knee: Secondary | ICD-10-CM

## 2018-09-19 DIAGNOSIS — J4520 Mild intermittent asthma, uncomplicated: Secondary | ICD-10-CM | POA: Diagnosis not present

## 2018-09-19 DIAGNOSIS — M542 Cervicalgia: Secondary | ICD-10-CM

## 2018-09-19 DIAGNOSIS — M545 Low back pain, unspecified: Secondary | ICD-10-CM

## 2018-09-19 DIAGNOSIS — E559 Vitamin D deficiency, unspecified: Secondary | ICD-10-CM

## 2018-09-19 DIAGNOSIS — E7800 Pure hypercholesterolemia, unspecified: Secondary | ICD-10-CM

## 2018-09-19 DIAGNOSIS — G4489 Other headache syndrome: Secondary | ICD-10-CM

## 2018-09-19 DIAGNOSIS — K219 Gastro-esophageal reflux disease without esophagitis: Secondary | ICD-10-CM

## 2018-09-19 DIAGNOSIS — G8929 Other chronic pain: Secondary | ICD-10-CM | POA: Insufficient documentation

## 2018-09-19 DIAGNOSIS — R739 Hyperglycemia, unspecified: Secondary | ICD-10-CM

## 2018-09-19 MED ORDER — MELOXICAM 15 MG PO TABS: 15.0000 mg | ORAL_TABLET | Freq: Every day | ORAL | 1 refills | Status: DC

## 2018-09-19 MED ORDER — NORTRIPTYLINE HCL 25 MG PO CAPS: 25.0000 mg | ORAL_CAPSULE | Freq: Every day | ORAL | 1 refills | Status: DC

## 2018-09-19 NOTE — Progress Notes (Signed)
Name: James Roman   MRN: [id number]    DOB: 1966/03/06   Date:09/19/2018       Progress Note  Subjective  Chief Complaint  Chief Complaint  Patient presents with  . Form Completion    [facility] paperwork    HPI  Hyperglycemia: last hgbA1C was normal done 08/2018 at the [facility], previously up to 5.8%, he denies polyphagia, polydipsia or polyuria.   Vitamin D deficiency: he has not been taking vitamin D supplementation   Hyperlipidemia: not taking statin therapy,last LDL was 149  but low ASCVD,no chest pain or palpitation, normal bp . We will continue to monitor. It showed an improvement LDL was 155 down to 149 and HDL went up from 41 to 49   Neck pain: improved since he changed jobs and no longer overusing right arm and shoulder. He is back on Nortriptyline taking 10 mg before bed because it helps with headaches. No longer has tingling or numbness on hands  Right knee pain and left lower back pain: he states symptoms have been going on for years, the knee symptoms based on my notes started in 2017 and was seen by Ortho at the time, he takes meloxicam, states pain is intermittent, sharp anterior right knee pain when he goes up or down stairs. He denies instability but he needs to stop and grab his leg. No swelling or redness. Pain on back is triggered by sitting for a prolonged period of time. Pain is present when he first stands up, also intermittent, sharp sensation that resolves when he takes a few steps. 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 He did not have any papers about disability upon his discharge.   Asthma: doing well, no cough, wheezing or SOB, no longer exposed to strong odors at work.Doing well on current position.   GERD: doing well at this time, no heartburn or reflux at this time, he has occasional regurgitation and takes otc medication seldom when he splurges Unchanged    Patient Active Problem List   Diagnosis Date Noted  . Vitamin D deficiency 09/04/2016  . Low serum phosphorus for age 19/23/2018  . Hyperglycemia 04/21/2015  . Hyperlipidemia 04/18/2015  . Asthma, mild intermittent, well-controlled 04/18/2015  . GERD (gastroesophageal reflux disease) 04/18/2015  . Cephalalgia 10/04/2014  . Cervical pain 10/04/2014    History reviewed. No pertinent surgical history.  Family History  Problem Relation Age of Onset  . Cancer Mother   . Hearing loss Father   . Hyperlipidemia Father   . Skin cancer Father        Back  . Cancer Sister        Skin  . Anemia Daughter     Social History   Socioeconomic History  . Marital status: Married    Spouse name: Manuela Schwartz  . Number of children: 2  . Years of education: Not on file  . Highest education level: High school graduate  Occupational History  . Not on file  Social Needs  . Financial resource strain: Somewhat hard  . Food insecurity:    Worry: Never true    Inability: Never true  . Transportation needs:    Medical: No    Non-medical: No  Tobacco Use  . Smoking status: Never Smoker  . Smokeless tobacco: Never Used  Substance and Sexual Activity  . Alcohol use: No    Alcohol/week: 0.0 standard drinks  . Drug use:  No  . Sexual activity: Yes    Partners: Female  Lifestyle  . Physical activity:    Days per week: 6 days    Minutes per session: 150+ min  . Stress: To some extent  Relationships  . Social connections:    Talks on phone: More than three times a week    Gets together: More than three times a week    Attends religious service: Never    Active member of club or organization: No    Attends meetings of clubs or organizations: Never    Relationship status: Married  . Intimate partner violence:    Fear of current or ex partner: No    Emotionally abused: No    Physically abused: No    Forced sexual activity: No  Other Topics Concern  . 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  meloxicam (MOBIC) 15 MG tablet, Take 1 tablet (15 mg total) by mouth daily., Disp: 90 tablet, Rfl: 1 .  nortriptyline (PAMELOR) 25 MG capsule, Take 1 capsule (25 mg total) by mouth at bedtime., Disp: 30 capsule, Rfl: 0  Allergies  Allergen Reactions  . Penicillin G Other (See Comments)    Gets sick-vomiting    I personally reviewed active problem list, medication list, allergies, family history, social history, health maintenance with the patient/caregiver today.   ROS  Constitutional: Negative for fever or weight change.  Respiratory: Negative for cough and shortness of breath.   Cardiovascular: Negative for chest pain or palpitations.  Gastrointestinal: Negative for abdominal pain, no bowel changes.  Musculoskeletal: positive for intermittent   gait problem but no  joint swelling.  Skin: Negative for rash.  Neurological: Negative for dizziness or headache.  No other specific complaints in a complete review of systems (except as listed in HPI above).  Objective  Vitals:   09/19/18 1445  BP: 140/80  Pulse: 90  Resp: 16  Temp: 98.2 F (36.8 C)  TempSrc: Oral  SpO2: 99%  Weight: 142 lb 12.8 oz (64.8 kg)  Height: 5\' 4"  (1.626 m)    Body mass index is 24.51 kg/m.  Physical Exam  Constitutional: Patient appears well-developed and well-nourished.  No distress.  HEENT: head atraumatic, normocephalic, pupils equal and reactive to light,  neck supple, throat within normal limits Cardiovascular: Normal rate, regular rhythm and normal heart sounds.  No murmur heard. No BLE edema. Pulmonary/Chest: Effort normal and breath sounds  normal. No respiratory distress. Abdominal: Soft.  There is no tenderness. Psychiatric: Patient has a normal mood and affect. behavior is normal. Judgment and thought content normal. Muscular Skeletal, normal rom of neck, no pain during palpation of lumbar spine and negative straight leg raise, normal gait, but uncomfortable when shifting position from sitting to standing. No crepitus during extension of knees  PHQ2/9: Depression screen [facility] 2/9 09/19/2018 03/03/2018 08/30/2017 02/26/2017 01/22/2016  Decreased Interest 0 0 0 0 0  Down, Depressed, Hopeless 0 0 0 0 0  PHQ - 2 Score 0 0 0 0  0  Altered sleeping 0 0 - - -  Tired, decreased energy 0 0 - - -  Change in appetite 0 0 - - -  Feeling bad or failure about yourself  0 0 - - -  Trouble concentrating 0 0 - - -  Moving slowly or fidgety/restless 0 0 - - -  Suicidal thoughts 0 0 - - -  PHQ-9 Score 0 0 - - -  Difficult doing work/chores - Not difficult at all - - -     Fall Risk: Fall Risk  09/19/2018 03/03/2018 08/30/2017 02/26/2017 01/22/2016  Falls in the past year? 0 No No No No  Number falls in past yr: 0 - - - -  Injury with Fall? 0 - - - -     Assessment & Plan  1. Cervical pain  - meloxicam (MOBIC) 15 MG tablet; Take 1 tablet (15 mg total) by mouth daily.  Dispense: 90 tablet; Refill: 1  2. Other headache syndrome  - nortriptyline (PAMELOR) 25 MG capsule; Take 1 capsule (25 mg total) by mouth at bedtime.  Dispense: 90 capsule; Refill: 1  3. Gastroesophageal reflux disease without esophagitis  Stable   4. Asthma, mild intermittent, well-controlled  Doing well at this time  5. Hyperglycemia  hgbAC reviewed from [place]  6. Pure hypercholesterolemia  Reviewed labs done at the [place]  7. Right knee pain, unspecified chronicity  He brought papers to be filled out for the [place], no previous history of ours notes  8. Chronic left-sided low back pain without sciatica  He also did not come in for this problem in the past but states  pain has been going on for many years   9. Vitamin D deficiency  Discussed importance of resuming supplementation

## 2018-09-22 ENCOUNTER — Encounter: Payer: Self-pay | Admitting: Family Medicine

## 2019-02-26 ENCOUNTER — Other Ambulatory Visit: Payer: Self-pay | Admitting: Family Medicine

## 2019-02-26 DIAGNOSIS — G4489 Other headache syndrome: Secondary | ICD-10-CM

## 2019-03-24 ENCOUNTER — Ambulatory Visit (INDEPENDENT_AMBULATORY_CARE_PROVIDER_SITE_OTHER): Payer: BC Managed Care – PPO | Admitting: Family Medicine

## 2019-03-24 ENCOUNTER — Encounter: Payer: Self-pay | Admitting: Family Medicine

## 2019-03-24 ENCOUNTER — Other Ambulatory Visit: Payer: Self-pay

## 2019-03-24 VITALS — BP 108/76 | HR 73 | Temp 97.6°F | Resp 16 | Ht 64.0 in | Wt 138.2 lb

## 2019-03-24 DIAGNOSIS — G4489 Other headache syndrome: Secondary | ICD-10-CM

## 2019-03-24 DIAGNOSIS — E7800 Pure hypercholesterolemia, unspecified: Secondary | ICD-10-CM

## 2019-03-24 DIAGNOSIS — K219 Gastro-esophageal reflux disease without esophagitis: Secondary | ICD-10-CM | POA: Diagnosis not present

## 2019-03-24 DIAGNOSIS — J4520 Mild intermittent asthma, uncomplicated: Secondary | ICD-10-CM

## 2019-03-24 DIAGNOSIS — M542 Cervicalgia: Secondary | ICD-10-CM

## 2019-03-24 DIAGNOSIS — Z0000 Encounter for general adult medical examination without abnormal findings: Secondary | ICD-10-CM

## 2019-03-24 DIAGNOSIS — Z23 Encounter for immunization: Secondary | ICD-10-CM

## 2019-03-24 MED ORDER — NORTRIPTYLINE HCL 25 MG PO CAPS: 25.0000 mg | ORAL_CAPSULE | Freq: Every day | ORAL | 1 refills | Status: DC

## 2019-03-24 MED ORDER — MELOXICAM 15 MG PO TABS: 15.0000 mg | ORAL_TABLET | Freq: Every day | ORAL | 1 refills | Status: DC

## 2019-03-24 NOTE — Progress Notes (Signed)
Name: James Roman   MRN: [id number]    DOB: June 10, 1966   Date:03/24/2019       Progress Note  Subjective  Chief Complaint  Chief Complaint  Patient presents with  . Annual Exam  . Follow-up    6 month follow up    HPI  Patient presents for annual CPE and follow up  Hyperglycemia: last hgbA1C was normal done 08/2018 at the [facility] and it was 5.6%, previously up to 5.8%, he denies polyphagia, polydipsia or polyuria.   Vitamin D deficiency:he has not been taking vitamin D supplementation Discussed rx vitamin D but he is not interested either   Hyperlipidemia: not taking statin therapy,last LDL was 149  but low ASCVD, no chest pain or palpitation, bp is at goal . We will continue to monitor. It showed an improvement LDL was 155 down to 149 and HDL went up from 41 to 49  The 10-year ASCVD risk score Mikey Bussing DC Brooke Bonito., et al., 2013) is: 5%*   Values used to calculate the score:     Age: 53 years     Sex: Male     Is Non-Hispanic African American: No     Diabetic: No     Tobacco smoker: No     Systolic Blood Pressure: 123XX123 mmHg     Is BP treated: No     HDL Cholesterol: 49 mg/dL*     Total Cholesterol: 229 mg/dL*     * - Cholesterol units were assumed for this score calculation  Neck pain: doing well on Meloxicam daily, also takes nortriptyline before bed and seems to help. Neck pops , pain is described as dull nuchal pain but nothing recently, doing well on medication. He states since job change over 6 months ago he is doing well  Asthma: doing well, no cough, wheezing or SOB,he refuses flu shot, explained the importance specially since COVID-19 pandemic  GERD: doing well at this time, no heartburn or reflux at this time, he has occasional regurgitation and takes otc medication prn only   USPSTF grade A and B recommendations:  Diet: he is a meat and potatoes type of person, does not like other vegetables, drinks pineapple juice Exercise: he has a physical job, stretches at  home   Depression: phq 9 is negative Depression screen [facility] 2/9 03/24/2019 09/19/2018 03/03/2018 08/30/2017 02/26/2017  Decreased Interest 0 0 0 0 0  Down, Depressed, Hopeless 0 0 0 0 0  PHQ - 2 Score 0 0 0 0 0  Altered sleeping 0 0 0 - -  Tired, decreased energy 1 0 0 - -  Change in appetite 0 0 0 - -  Feeling bad or failure about yourself  0 0 0 - -  Trouble concentrating 0 0 0 - -  Moving slowly or fidgety/restless 0 0 0 - -  Suicidal thoughts 0 0 0 - -  PHQ-9 Score 1 0 0 - -  Difficult doing work/chores Not difficult at all - Not difficult at all - -    Hypertension:  BP Readings from Last 3 Encounters:  03/24/19 108/76  09/19/18 140/80  03/03/18 118/72    Obesity: Wt Readings from Last 3 Encounters:  03/24/19 138 lb 3.2 oz (62.7 kg)  09/19/18 142 lb 12.8 oz (64.8 kg)  03/03/18 139 lb 12.8 oz (63.4 kg)   BMI Readings from Last 3 Encounters:  03/24/19 23.72 kg/m  09/19/18 24.51 kg/m  03/03/18 24.00 kg/m     Lipids:  Lab  Results  Component Value Date   CHOL 229 (A) 08/19/2018   CHOL 218 (H) 03/03/2018   CHOL 229 (H) 02/26/2017   Lab Results  Component Value Date   HDL 49 08/19/2018   HDL 41 03/03/2018   HDL 46 02/26/2017   Lab Results  Component Value Date   LDLCALC 149 08/19/2018   LDLCALC 155 (H) 03/03/2018   LDLCALC 165 (H) 02/26/2017   Lab Results  Component Value Date   TRIG 163 (A) 08/19/2018   TRIG 108 03/03/2018   TRIG 90 02/26/2017   Lab Results  Component Value Date   CHOLHDL 5.3 (H) 03/03/2018   CHOLHDL 5.0 (H) 02/26/2017   CHOLHDL 3.8 01/22/2016   No results found for: LDLDIRECT Glucose:  Glucose  Date Value Ref Range Status  04/13/2012 135 (H) 65 - 99 mg/dL Final   Glucose, Bld  Date Value Ref Range Status  03/03/2018 91 65 - 139 mg/dL Final    Comment:    .        Non-fasting reference interval .   02/26/2017 85 65 - 99 mg/dL Final  01/22/2016 92 65 - 99 mg/dL Final      [facility] Visit from 03/03/2018 in [facility]  AUDIT-C Score  0      Married STD testing and prevention (HIV/chl/gon/syphilis): N/A Hep C: up to date   Skin cancer:discussed atypical lesions  Colorectal cancer: repeat in 2025  Prostate cancer: up to date  Lab Results  Component Value Date   PSA 0.69 08/19/2018   PSA 1.0 08/27/2016   PSA 1.1 normal 04/19/2014    IPSS Questionnaire (AUA-7): Over the past month.   1)  How often have you had a sensation of not emptying your bladder completely after you finish urinating?  0 - Not at all  2)  How often have you had to urinate again less than two hours after you finished urinating? 0 - Not at all  3)  How often have you found you stopped and started again several times when you urinated?  0 - Not at all  4) How difficult have you found it to postpone urination?  0 - Not at all  5) How often have you had a weak urinary stream?  0 - Not at all  6) How often have you had to push or strain to begin urination?  0 - Not at all  7) How many times did you most typically get up to urinate from the time you went to bed until the time you got up in the morning?  0 - None  Total score:  0-7 mildly symptomatic   8-19 moderately symptomatic   20-35 severely symptomatic    Lung cancer:   Low Dose CT Chest recommended if Age 31-80 years, 30 pack-year currently smoking OR have quit w/in 15years. Patient does not qualify.   AAA:  The USPSTF recommends one-time screening with ultrasonography in men ages 4 to 95 years who have ever smoked ECG:  2013  Advanced Care Planning: A voluntary discussion about advance care planning including the explanation and discussion of advance directives.  Discussed health care proxy and Living will, and the patient was able to identify a health care proxy as wife .  Patient does not know have a living will at present time.   Patient Active Problem List   Diagnosis Date Noted  . Chronic left-sided low back pain without sciatica 09/19/2018  . Right  knee pain  09/19/2018  . Vitamin D deficiency 09/04/2016  . Low serum phosphorus for age 59/23/2018  . Hyperglycemia 04/21/2015  . Hyperlipidemia 04/18/2015  . Asthma, mild intermittent, well-controlled 04/18/2015  . GERD (gastroesophageal reflux disease) 04/18/2015  . Cephalalgia 10/04/2014  . Cervical pain 10/04/2014    No past surgical history on file.  Family History  Problem Relation Age of Onset  . Cancer Mother   . Hearing loss Father   . Hyperlipidemia Father   . Skin cancer Father        Back  . Cancer Sister        Skin  . Anemia Daughter     Social History   Socioeconomic History  . Marital status: Married    Spouse name: Manuela Schwartz  . Number of children: 2  . Years of education: Not on file  . Highest education level: High school graduate  Occupational History  . Not on file  Social Needs  . Financial resource strain: Somewhat hard  . Food insecurity    Worry: Never true    Inability: Never true  . Transportation needs    Medical: No    Non-medical: No  Tobacco Use  . Smoking status: Never Smoker  . Smokeless tobacco: Never Used  Substance and Sexual Activity  . Alcohol use: No    Alcohol/week: 0.0 standard drinks  . Drug use: No  . Sexual activity: Yes    Partners: Female  Lifestyle  . Physical activity    Days per week: 6 days    Minutes per session: 150+ min  . Stress: To some extent  Relationships  . Social connections    Talks on phone: More than three times a week    Gets together: More than three times a week    Attends religious service: Never    Active member of club or organization: No    Attends meetings of clubs or organizations: Never    Relationship status: Married  . Intimate partner violence    Fear of current or ex partner: No    Emotionally abused: No    Physically abused: No    Forced sexual activity: No  Other Topics Concern  . 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  meloxicam (MOBIC) 15 MG tablet, Take 1 tablet (15 mg total) by mouth daily., Disp: 90 tablet, Rfl: 1 .  nortriptyline (PAMELOR) 25 MG capsule, TAKE 1 CAPSULE (25 MG TOTAL) BY MOUTH AT BEDTIME., Disp: 60 capsule, Rfl: 0  Allergies  Allergen Reactions  . Penicillin G Other (See Comments)    Gets sick-vomiting     ROS  Constitutional: Negative for fever or weight change.  Respiratory: Negative for cough and shortness of breath.   Cardiovascular: Negative for chest pain or palpitations.  Gastrointestinal: Negative for abdominal pain, no bowel changes.  Musculoskeletal: Negative for gait problem or joint swelling.  Skin: Negative for rash.  Neurological: Negative for dizziness or headache.  No other specific complaints in a complete review of systems (  except as listed in HPI above).   Objective  Vitals:   03/24/19 0807  BP: 108/76  Pulse: 73  Resp: 16  Temp: 97.6 F (36.4 C)  SpO2: 99%  Weight: 138 lb 3.2 oz (62.7 kg)  Height: 5\' 4"  (1.626 m)    Body mass index is 23.72 kg/m.  Physical Exam  Constitutional: Patient appears well-developed and well-nourished. No distress.  HENT: Head: Normocephalic and atraumatic. Ears: B TMs ok, no erythema or effusion; Nose: Nose normal. Mouth/Throat: Oropharynx is clear and moist. No oropharyngeal exudate.  Eyes: Conjunctivae and EOM are normal. Pupils are equal, round, and reactive to light. No scleral icterus.  Neck: Normal range of motion. Neck supple. No JVD present. No thyromegaly present.  Cardiovascular: Normal rate, regular rhythm and normal heart sounds.  No murmur heard. No BLE edema. Pulmonary/Chest: Effort normal  and breath sounds normal. No respiratory distress. Abdominal: Soft. Bowel sounds are normal, no distension. There is no tenderness. no masses MALE GENITALIA: Normal descended testes bilaterally, no masses palpated, no hernias, no lesions, no discharge RECTAL: Prostate normal size and consistency, no rectal masses or hemorrhoids Musculoskeletal: Normal range of motion, no joint effusions. No gross deformities Neurological: he is alert and oriented to person, place, and time. No cranial nerve deficit. Coordination, balance, strength, speech and gait are normal.  Skin: Skin is warm and dry. No rash noted. No erythema.  Psychiatric: Patient has a normal mood and affect. behavior is normal. Judgment and thought content normal.   PHQ2/9: Depression screen [facility] 2/9 03/24/2019 09/19/2018 03/03/2018 08/30/2017 02/26/2017  Decreased Interest 0 0 0 0 0  Down, Depressed, Hopeless 0 0 0 0 0  PHQ - 2 Score 0 0 0 0 0  Altered sleeping 0 0 0 - -  Tired, decreased energy 1 0 0 - -  Change in appetite 0 0 0 - -  Feeling bad or failure about yourself  0 0 0 - -  Trouble concentrating 0 0 0 - -  Moving slowly or fidgety/restless 0 0 0 - -  Suicidal thoughts 0 0 0 - -  PHQ-9 Score 1 0 0 - -  Difficult doing work/chores Not difficult at all - Not difficult at all - -     Fall Risk: Fall Risk  03/24/2019 09/19/2018 03/03/2018 08/30/2017 02/26/2017  Falls in the past year? 0 0 No No No  Number falls in past yr: 0 0 - - -  Injury with Fall? 0 0 - - -    Functional Status Survey: Is the patient deaf or have difficulty hearing?: No Does the patient have difficulty seeing, even when wearing glasses/contacts?: No Does the patient have difficulty concentrating, remembering, or making decisions?: No Does the patient have difficulty walking or climbing stairs?: No Does the patient have difficulty dressing or bathing?: No Does the patient have difficulty doing errands alone such as visiting a doctor's office or shopping?:  No    Assessment & Plan  1. Asthma, mild intermittent, well-controlled  Doing well at this time  2. Gastroesophageal reflux disease without esophagitis  Taking prn medication   3. Pure hypercholesterolemia  Discussed healthier diet   4. Other headache syndrome  - nortriptyline (PAMELOR) 25 MG capsule; Take 1 capsule (25 mg total) by mouth at bedtime.  Dispense: 90 capsule; Refill: 1  5. Well adult exam  He refused flu vaccine , also discussed shingrix but he will think about it, she also qualifies for PCV 23 but he refused today  6. Cervical pain  - meloxicam (MOBIC) 15 MG tablet; Take 1 tablet (15 mg total) by mouth daily.  Dispense: 90 tablet; Refill: 1  -Prostate cancer screening and PSA options (with potential risks and benefits of testing vs not testing) were discussed along with recent recs/guidelines. -USPSTF grade A and B recommendations reviewed with patient; age-appropriate recommendations, preventive care, screening tests, etc discussed and encouraged; healthy living encouraged; see AVS for patient education given to patient -Discussed importance of 150 minutes of physical activity weekly, eat two servings of fish weekly, eat one serving of tree nuts ( cashews, pistachios, pecans, almonds.Marland Kitchen) every other day, eat 6 servings of fruit/vegetables daily and drink plenty of water and avoid sweet beverages.

## 2019-03-24 NOTE — Patient Instructions (Signed)
Preventive Care 40-55 Years Old, Male Preventive care refers to lifestyle choices and visits with your health care provider that can promote health and wellness. This includes:  A yearly physical exam. This is also called an annual well check.  Regular dental and eye exams.  Immunizations.  Screening for certain conditions.  Healthy lifestyle choices, such as eating a healthy diet, getting regular exercise, not using drugs or products that contain nicotine and tobacco, and limiting alcohol use. What can I expect for my preventive care visit? Physical exam Your health care provider will check:  Height and weight. These may be used to calculate body mass index (BMI), which is a measurement that tells if you are at a healthy weight.  Heart rate and blood pressure.  Your skin for abnormal spots. Counseling Your health care provider may ask you questions about:  Alcohol, tobacco, and drug use.  Emotional well-being.  Home and relationship well-being.  Sexual activity.  Eating habits.  Work and work environment. What immunizations do I need?  Influenza (flu) vaccine  This is recommended every year. Tetanus, diphtheria, and pertussis (Tdap) vaccine  You may need a Td booster every 10 years. Varicella (chickenpox) vaccine  You may need this vaccine if you have not already been vaccinated. Zoster (shingles) vaccine  You may need this after age 60. Measles, mumps, and rubella (MMR) vaccine  You may need at least one dose of MMR if you were born in 1957 or later. You may also need a second dose. Pneumococcal conjugate (PCV13) vaccine  You may need this if you have certain conditions and were not previously vaccinated. Pneumococcal polysaccharide (PPSV23) vaccine  You may need one or two doses if you smoke cigarettes or if you have certain conditions. Meningococcal conjugate (MenACWY) vaccine  You may need this if you have certain conditions. Hepatitis A vaccine   You may need this if you have certain conditions or if you travel or work in places where you may be exposed to hepatitis A. Hepatitis B vaccine  You may need this if you have certain conditions or if you travel or work in places where you may be exposed to hepatitis B. Haemophilus influenzae type b (Hib) vaccine  You may need this if you have certain risk factors. Human papillomavirus (HPV) vaccine  If recommended by your health care provider, you may need three doses over 6 months. You may receive vaccines as individual doses or as more than one vaccine together in one shot (combination vaccines). Talk with your health care provider about the risks and benefits of combination vaccines. What tests do I need? Blood tests  Lipid and cholesterol levels. These may be checked every 5 years, or more frequently if you are over 50 years old.  Hepatitis C test.  Hepatitis B test. Screening  Lung cancer screening. You may have this screening every year starting at age 55 if you have a 30-pack-year history of smoking and currently smoke or have quit within the past 15 years.  Prostate cancer screening. Recommendations will vary depending on your family history and other risks.  Colorectal cancer screening. All adults should have this screening starting at age 50 and continuing until age 75. Your health care provider may recommend screening at age 45 if you are at increased risk. You will have tests every 1-10 years, depending on your results and the type of screening test.  Diabetes screening. This is done by checking your blood sugar (glucose) after you have not eaten   for a while (fasting). You may have this done every 1-3 years.  Sexually transmitted disease (STD) testing. Follow these instructions at home: Eating and drinking  Eat a diet that includes fresh fruits and vegetables, whole grains, lean protein, and low-fat dairy products.  Take vitamin and mineral supplements as recommended  by your health care provider.  Do not drink alcohol if your health care provider tells you not to drink.  If you drink alcohol: ? Limit how much you have to 0-2 drinks a day. ? Be aware of how much alcohol is in your drink. In the [place] one drink equals one 12 oz bottle of beer (355 mL), one 5 oz glass of wine (148 mL), or one 1 oz glass of hard liquor (44 mL). Lifestyle  Take daily care of your teeth and gums.  Stay active. Exercise for at least 30 minutes on 5 or more days each week.  Do not use any products that contain nicotine or tobacco, such as cigarettes, e-cigarettes, and chewing tobacco. If you need help quitting, ask your health care provider.  If you are sexually active, practice safe sex. Use a condom or other form of protection to prevent STIs (sexually transmitted infections).  Talk with your health care provider about taking a low-dose aspirin every day starting at age 33. What's next?  Go to your health care provider once a year for a well check visit.  Ask your health care provider how often you should have your eyes and teeth checked.  Stay up to date on all vaccines. This information is not intended to replace advice given to you by your health care provider. Make sure you discuss any questions you have with your health care provider. Document Released: 07/26/2015 Document Revised: 06/23/2018 Document Reviewed: 06/23/2018 Elsevier Patient Education  2020 [employer].

## 2019-07-17 DIAGNOSIS — G8929 Other chronic pain: Secondary | ICD-10-CM | POA: Diagnosis not present

## 2019-07-17 DIAGNOSIS — R519 Headache, unspecified: Secondary | ICD-10-CM | POA: Diagnosis not present

## 2019-07-17 DIAGNOSIS — M25561 Pain in right knee: Secondary | ICD-10-CM | POA: Diagnosis not present

## 2019-09-25 ENCOUNTER — Ambulatory Visit: Payer: Self-pay | Admitting: Family Medicine

## 2019-10-03 ENCOUNTER — Encounter: Payer: Self-pay | Admitting: Family Medicine

## 2019-10-03 ENCOUNTER — Ambulatory Visit (INDEPENDENT_AMBULATORY_CARE_PROVIDER_SITE_OTHER): Payer: BC Managed Care – PPO | Admitting: Family Medicine

## 2019-10-03 ENCOUNTER — Other Ambulatory Visit: Payer: Self-pay

## 2019-10-03 VITALS — BP 120/80 | HR 83 | Temp 97.9°F | Resp 18 | Ht 64.0 in | Wt 142.9 lb

## 2019-10-03 DIAGNOSIS — E7800 Pure hypercholesterolemia, unspecified: Secondary | ICD-10-CM

## 2019-10-03 DIAGNOSIS — E559 Vitamin D deficiency, unspecified: Secondary | ICD-10-CM | POA: Diagnosis not present

## 2019-10-03 DIAGNOSIS — Z79899 Other long term (current) drug therapy: Secondary | ICD-10-CM | POA: Diagnosis not present

## 2019-10-03 DIAGNOSIS — M545 Low back pain, unspecified: Secondary | ICD-10-CM

## 2019-10-03 DIAGNOSIS — M25561 Pain in right knee: Secondary | ICD-10-CM

## 2019-10-03 DIAGNOSIS — R739 Hyperglycemia, unspecified: Secondary | ICD-10-CM

## 2019-10-03 DIAGNOSIS — G8929 Other chronic pain: Secondary | ICD-10-CM

## 2019-10-03 DIAGNOSIS — K219 Gastro-esophageal reflux disease without esophagitis: Secondary | ICD-10-CM

## 2019-10-03 DIAGNOSIS — M542 Cervicalgia: Secondary | ICD-10-CM

## 2019-10-03 DIAGNOSIS — J4520 Mild intermittent asthma, uncomplicated: Secondary | ICD-10-CM

## 2019-10-03 DIAGNOSIS — G4489 Other headache syndrome: Secondary | ICD-10-CM

## 2019-10-03 MED ORDER — MELOXICAM 15 MG PO TABS: 15.0000 mg | ORAL_TABLET | Freq: Every day | ORAL | 1 refills | Status: DC

## 2019-10-03 MED ORDER — NORTRIPTYLINE HCL 25 MG PO CAPS: 25.0000 mg | ORAL_CAPSULE | Freq: Every day | ORAL | 1 refills | Status: DC

## 2019-10-03 NOTE — Progress Notes (Signed)
Name: James Roman   MRN: [id number]    DOB: May 03, 1966   Date:10/03/2019       Progress Note  Subjective  Chief Complaint  Chief Complaint  Patient presents with  . Follow-up    6 month recheck    HPI  Hyperglycemia: last hgbA1C was normaldone 08/2018 at the [facility] and it was 5.6%, previously up to 5.8%, he denies polyphagia, polydipsia or polyuria.We will recheck it today   Vitamin D deficiency:he has not been taking vitamin D supplementationDiscussed rx vitamin D but he is not interested either . Discussed importance of resuming supplementation   Hyperlipidemia: not taking statin therapy,last LDL was149but low ASCVD, no chest pain or palpitation, bp is at goal . We will continue to monitor. It showed an improvement LDL was 155 down to 149 and HDL went up from 41 to 49 The 10-year ASCVD risk score Mikey Bussing DC Brooke Bonito., et al., 2013) is: 6.5%*   Values used to calculate the score:     Age: 54 years     Sex: Male     Is Non-Hispanic African American: No     Diabetic: No     Tobacco smoker: No     Systolic Blood Pressure: 123456 mmHg     Is BP treated: No     HDL Cholesterol: 49 mg/dL*     Total Cholesterol: 229 mg/dL*     * - Cholesterol units were assumed for this score calculation   Neck pain: doing well on Meloxicam daily, also takes nortriptyline before bed and seems to help. Neck pop,  pain is described as dull nuchal pain but nothing recently, doing well on medication. He states since job change over 12  months ago he is doing well.  Asthma: doing well, no cough, wheezing or SOB, he did not get flu vaccine but is willing to get COVID-19 vaccine  GERD: doing well at this time, no heartburn or reflux at this time, he has occasional regurgitation and takes otc medication prn only . Unchanged   Chronic Right knee pain: he states back in Nov he had a limp because of pain, no redness or increase in warmth, no effusion. Otherwise it is a 1-2/10 worse at work better when  resting. Going up steps also aggravates , symptoms started in 1988 while in the [employer] , had some PT and medication and symptoms resolved, but continues to have problem since   Patient Active Problem List   Diagnosis Date Noted  . Chronic left-sided low back pain without sciatica 09/19/2018  . Right knee pain 09/19/2018  . Vitamin D deficiency 09/04/2016  . Low serum phosphorus for age 54/23/2018  . Hyperglycemia 04/21/2015  . Hyperlipidemia 04/18/2015  . Asthma, mild intermittent, well-controlled 04/18/2015  . GERD (gastroesophageal reflux disease) 04/18/2015  . Cephalalgia 10/04/2014  . Cervical pain 10/04/2014    No past surgical history on file.  Family History  Problem Relation Age of Onset  . Cancer Mother   . Hearing loss Father   . Hyperlipidemia Father   . Skin cancer Father        Back  . Cancer Sister        Skin  . Anemia Daughter     Social History   Tobacco Use  . Smoking status: Never Smoker  . Smokeless tobacco: Never Used  Substance Use Topics  . Alcohol use: No    Alcohol/week: 0.0 standard drinks     Current Outpatient Medications:  .  meloxicam (MOBIC)  15 MG tablet, Take 1 tablet (15 mg total) by mouth daily., Disp: 90 tablet, Rfl: 1 .  nortriptyline (PAMELOR) 25 MG capsule, Take 1 capsule (25 mg total) by mouth at bedtime., Disp: 90 capsule, Rfl: 1  Allergies  Allergen Reactions  . Penicillin G Other (See Comments)    Gets sick-vomiting    I personally reviewed active problem list, medication list, allergies, family history, social history, health maintenance with the patient/caregiver today.   ROS  Constitutional: Negative for fever or weight change.  Respiratory: Negative for cough and shortness of breath.   Cardiovascular: Negative for chest pain or palpitations.  Gastrointestinal: Negative for abdominal pain, no bowel changes.  Musculoskeletal: Negative for gait problem or joint swelling.  Skin: Negative for rash.   Neurological: Negative for dizziness or headache.  No other specific complaints in a complete review of systems (except as listed in HPI above).  Objective  Vitals:   10/03/19 0756  BP: 120/80  Pulse: 83  Resp: 18  Temp: 97.9 F (36.6 C)  TempSrc: Temporal  SpO2: 98%  Weight: 142 lb 14.4 oz (64.8 kg)  Height: 5\' 4"  (1.626 m)    Body mass index is 24.53 kg/m.  Physical Exam  Constitutional: Patient appears well-developed and well-nourished. No distress.  HEENT: head atraumatic, normocephalic, pupils equal and reactive to light Cardiovascular: Normal rate, regular rhythm and normal heart sounds.  No murmur heard. No BLE edema. Pulmonary/Chest: Effort normal and breath sounds normal. No respiratory distress. Abdominal: Soft.  There is no tenderness. Psychiatric: Patient has a normal mood and affect. behavior is normal. Judgment and thought content normal.  PHQ2/9: Depression screen [facility] 2/9 10/03/2019 03/24/2019 09/19/2018 03/03/2018 08/30/2017  Decreased Interest 0 0 0 0 0  Down, Depressed, Hopeless 0 0 0 0 0  PHQ - 2 Score 0 0 0 0 0  Altered sleeping 2 0 0 0 -  Tired, decreased energy 0 1 0 0 -  Change in appetite 0 0 0 0 -  Feeling bad or failure about yourself  0 0 0 0 -  Trouble concentrating 0 0 0 0 -  Moving slowly or fidgety/restless 0 0 0 0 -  Suicidal thoughts 0 0 0 0 -  PHQ-9 Score 2 1 0 0 -  Difficult doing work/chores Not difficult at all Not difficult at all - Not difficult at all -    phq 9 is negative   Fall Risk: Fall Risk  10/03/2019 03/24/2019 09/19/2018 03/03/2018 08/30/2017  Falls in the past year? 0 0 0 No No  Number falls in past yr: 0 0 0 - -  Injury with Fall? 0 0 0 - -  Follow up Falls evaluation completed - - - -    Assessment & Plan  1. Chronic left-sided low back pain without sciatica   2. Pure hypercholesterolemia  - Lipid panel  3. Vitamin D deficiency  Resume vitamin D supplementation   4. Hyperglycemia  - Hemoglobin A1c  5.  Gastroesophageal reflux disease without esophagitis  On prn otc medication  6. Asthma, mild intermittent, well-controlled   7. Chronic pain of right knee  He has disability through the [place]  8. Chronic neck pain  Continue medication   9. Long-term use of high-risk medication  - CBC with Differential/Platelet - COMPLETE METABOLIC PANEL WITH GFR

## 2019-10-04 LAB — HEMOGLOBIN A1C
Hgb A1c MFr Bld: 5.5 % of total Hgb (ref <5.7)
Mean Plasma Glucose: 111 (calc)
eAG (mmol/L): 6.2 (calc)

## 2019-10-04 LAB — COMPLETE METABOLIC PANEL WITH GFR
AG Ratio: 1.8 (calc) (ref 1.0–2.5)
ALT: 22 U/L (ref 9–46)
AST: 17 U/L (ref 10–35)
Albumin: 4.4 g/dL (ref 3.6–5.1)
Alkaline phosphatase (APISO): 65 U/L (ref 35–144)
BUN/Creatinine Ratio: 15 (calc) (ref 6–22)
BUN: 21 mg/dL (ref 7–25)
CO2: 29 mmol/L (ref 20–32)
Calcium: 9.7 mg/dL (ref 8.6–10.3)
Chloride: 103 mmol/L (ref 98–110)
Creat: 1.4 mg/dL — ABNORMAL HIGH (ref 0.70–1.33)
GFR, Est African American: 65 mL/min/{1.73_m2} (ref >=60)
GFR, Est Non African American: 56 mL/min/{1.73_m2} — ABNORMAL LOW (ref >=60)
Globulin: 2.4 g/dL (calc) (ref 1.9–3.7)
Glucose, Bld: 90 mg/dL (ref 65–99)
Potassium: 5.2 mmol/L (ref 3.5–5.3)
Sodium: 138 mmol/L (ref 135–146)
Total Bilirubin: 0.5 mg/dL (ref 0.2–1.2)
Total Protein: 6.8 g/dL (ref 6.1–8.1)

## 2019-10-04 LAB — CBC WITH DIFFERENTIAL/PLATELET
Absolute Monocytes: 923 cells/uL (ref 200–950)
Basophils Absolute: 39 cells/uL (ref 0–200)
Basophils Relative: 0.6 %
Eosinophils Absolute: 143 cells/uL (ref 15–500)
Eosinophils Relative: 2.2 %
HCT: 43.5 % (ref 38.5–50.0)
Hemoglobin: 14.8 g/dL (ref 13.2–17.1)
Lymphs Abs: 2750 cells/uL (ref 850–3900)
MCH: 31.5 pg (ref 27.0–33.0)
MCHC: 34 g/dL (ref 32.0–36.0)
MCV: 92.6 fL (ref 80.0–100.0)
MPV: 9.6 fL (ref 7.5–12.5)
Monocytes Relative: 14.2 %
Neutro Abs: 2646 cells/uL (ref 1500–7800)
Neutrophils Relative %: 40.7 %
Platelets: 319 10*3/uL (ref 140–400)
RBC: 4.7 10*6/uL (ref 4.20–5.80)
RDW: 12.1 % (ref 11.0–15.0)
Total Lymphocyte: 42.3 %
WBC: 6.5 10*3/uL (ref 3.8–10.8)

## 2019-10-04 LAB — LIPID PANEL
Cholesterol: 206 mg/dL — ABNORMAL HIGH (ref <200)
HDL: 34 mg/dL — ABNORMAL LOW (ref >=40)
LDL Cholesterol (Calc): 137 mg/dL (calc) — ABNORMAL HIGH
Non-HDL Cholesterol (Calc): 172 mg/dL (calc) — ABNORMAL HIGH (ref <130)
Total CHOL/HDL Ratio: 6.1 (calc) — ABNORMAL HIGH (ref <5.0)
Triglycerides: 211 mg/dL — ABNORMAL HIGH (ref <150)

## 2019-10-06 ENCOUNTER — Telehealth: Payer: Self-pay | Admitting: *Deleted

## 2019-10-06 NOTE — Telephone Encounter (Signed)
This encounter was created in error - please disregard.

## 2019-10-06 NOTE — Addendum Note (Signed)
Addended by: Addison Naegeli on: 10/06/2019 10:09 AM   Modules accepted: Level of Service, SmartSet

## 2019-10-06 NOTE — Telephone Encounter (Signed)
Charted in error.

## 2020-01-23 DIAGNOSIS — M1711 Unilateral primary osteoarthritis, right knee: Secondary | ICD-10-CM | POA: Diagnosis not present

## 2020-04-10 NOTE — Progress Notes (Signed)
Name: James Roman   MRN: [id number]    DOB: Nov 06, 1965   Date:04/11/2020       Progress Note  Subjective  Chief Complaint  Chief Complaint  Patient presents with  . Annual Exam    HPI  Patient presents for annual CPE and follow up.  Hyperglycemia: last hgbA1C was normal was 5.5 % , previously up to 5.8%, he denies polyphagia, polydipsia or polyuria  Vitamin D deficiency: not urrently taking any supplements, we will recheck labs today   Hyperlipidemia: not taking statin therapy,last LDL was high  The 10-year ASCVD risk score Mikey Bussing DC Jr., et al., 2013) is: 7.8%   Values used to calculate the score:     Age: 54 years     Sex: Male     Is Non-Hispanic African American: No     Diabetic: No     Tobacco smoker: No     Systolic Blood Pressure: 735 mmHg     Is BP treated: No     HDL Cholesterol: 34 mg/dL     Total Cholesterol: 206 mg/dL    Neck pain: improved since he changed jobs and no longer overusing right arm and shoulder. He is back on Nortriptyline taking 20 mg at night because it helps with headaches , but episodes at most once a month, still on the right nuchal area when present. No longer has tingling or numbness on hands  Asthma: doing well, no cough, wheezing or SOB, no longer exposed to strong odors at work.Discussed a rx of rescue inhaler to have at home   GERD: doing well at this time, no heartburn or reflux at this time, he has occasional regurgitation and takes otc medication seldom when he splurges like when he eats pizza   OA right knee: seen by [facility] and had Xray that showed mild medial OA with narrowing of joint space, he is on Meloxicam and is getting PT that he is working on it at home. Pain is from 2-8 , it is intermittent no redness or swelling.   IPSS Questionnaire (AUA-7): Over the past month.   1)  How often have you had a sensation of not emptying your bladder completely after you finish urinating?  0 - Not at all  2)  How often  have you had to urinate again less than two hours after you finished urinating? 0 - Not at all  3)  How often have you found you stopped and started again several times when you urinated?  0 - Not at all  4) How difficult have you found it to postpone urination?  0 - Not at all  5) How often have you had a weak urinary stream?  0 - Not at all  6) How often have you had to push or strain to begin urination?  0 - Not at all  7) How many times did you most typically get up to urinate from the time you went to bed until the time you got up in the morning?  0 - None  Total score:  0-7 mildly symptomatic   8-19 moderately symptomatic   20-35 severely symptomatic     Diet: discussed a balanced diet  Exercise: discussed 150 minutes per week   Depression: phq 9 is negative Depression screen [facility] 2/9 04/11/2020 10/03/2019 03/24/2019 09/19/2018 03/03/2018  Decreased Interest 0 0 0 0 0  Down, Depressed, Hopeless 0 0 0 0 0  PHQ - 2 Score 0 0 0 0 0  Altered sleeping - 2 0 0 0  Tired, decreased energy - 0 1 0 0  Change in appetite - 0 0 0 0  Feeling bad or failure about yourself  - 0 0 0 0  Trouble concentrating - 0 0 0 0  Moving slowly or fidgety/restless - 0 0 0 0  Suicidal thoughts - 0 0 0 0  PHQ-9 Score - 2 1 0 0  Difficult doing work/chores - Not difficult at all Not difficult at all - Not difficult at all    Hypertension:  BP Readings from Last 3 Encounters:  04/11/20 120/83  10/03/19 120/80  03/24/19 108/76    Obesity: Wt Readings from Last 3 Encounters:  04/11/20 140 lb 14.4 oz (63.9 kg)  10/03/19 142 lb 14.4 oz (64.8 kg)  03/24/19 138 lb 3.2 oz (62.7 kg)   BMI Readings from Last 3 Encounters:  04/11/20 24.96 kg/m  10/03/19 24.53 kg/m  03/24/19 23.72 kg/m     Lipids:  Lab Results  Component Value Date   CHOL 206 (H) 10/03/2019   CHOL 229 (A) 08/19/2018   CHOL 218 (H) 03/03/2018   Lab Results  Component Value Date   HDL 34 (L) 10/03/2019   HDL 49 08/19/2018   HDL 41  03/03/2018   Lab Results  Component Value Date   LDLCALC 137 (H) 10/03/2019   LDLCALC 149 08/19/2018   LDLCALC 155 (H) 03/03/2018   Lab Results  Component Value Date   TRIG 211 (H) 10/03/2019   TRIG 163 (A) 08/19/2018   TRIG 108 03/03/2018   Lab Results  Component Value Date   CHOLHDL 6.1 (H) 10/03/2019   CHOLHDL 5.3 (H) 03/03/2018   CHOLHDL 5.0 (H) 02/26/2017   No results found for: LDLDIRECT Glucose:  Glucose  Date Value Ref Range Status  04/13/2012 135 (H) 65 - 99 mg/dL Final   Glucose, Bld  Date Value Ref Range Status  10/03/2019 90 65 - 99 mg/dL Final    Comment:    .            Fasting reference interval .   03/03/2018 91 65 - 139 mg/dL Final    Comment:    .        Non-fasting reference interval .   02/26/2017 85 65 - 99 mg/dL Final      [facility] Visit from 10/03/2019 in [facility]  AUDIT-C Score 1      Married STD testing and prevention (HIV/chl/gon/syphilis):  Hep C: 2013  Skin cancer: Discussed monitoring for atypical lesions Colorectal cancer: repeat 2025  Prostate cancer:  Lab Results  Component Value Date   PSA 0.69 08/19/2018   PSA 1.0 08/27/2016   PSA 1.1 normal 04/19/2014     Lung cancer:   Low Dose CT Chest recommended if Age 77-80 years, 30 pack-year currently smoking OR have quit w/in 15years. Patient does not qualify.   ECG:  2013  Vaccines:   Shingrix: 8-64 yo and ask insurance if covered when patient above 12 yo  Pneumonia: educated and discussed with patient. Flu:  educated and discussed with patient. He refused   Advanced Care Planning: A voluntary discussion about advance care planning including the explanation and discussion of advance directives.  Discussed health care proxy and Living will, and the patient was able to identify a health care proxy as wife .  Patient does not have a living will at present time. If patient does have living will, I have requested they  bring this to the clinic to be  scanned in to their chart.  Patient Active Problem List   Diagnosis Date Noted  . Chronic left-sided low back pain without sciatica 09/19/2018  . Right knee pain 09/19/2018  . Vitamin D deficiency 09/04/2016  . Low serum phosphorus for age 60/23/2018  . Hyperglycemia 04/21/2015  . Hyperlipidemia 04/18/2015  . Asthma, mild intermittent, well-controlled 04/18/2015  . GERD (gastroesophageal reflux disease) 04/18/2015  . Cephalalgia 10/04/2014  . Cervical pain 10/04/2014    History reviewed. No pertinent surgical history.  Family History  Problem Relation Age of Onset  . Cancer Mother   . Hearing loss Father   . Hyperlipidemia Father   . Skin cancer Father        Back  . Cancer Sister        Skin  . Anemia Daughter     Social History   Socioeconomic History  . Marital status: Married    Spouse name: Manuela Schwartz  . Number of children: 2  . Years of education: Not on file  . Highest education level: High school graduate  Occupational History  . Not on file  Tobacco Use  . Smoking status: Never Smoker  . Smokeless tobacco: Never Used  Vaping Use  . Vaping Use: Never used  Substance and Sexual Activity  . Alcohol use: No    Alcohol/week: 0.0 standard drinks  . Drug use: No  . Sexual activity: Yes    Partners: Female  Other Topics Concern  . 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 Difficulty of Paying Living Expenses: Not hard at all  Food Insecurity: No Food Insecurity  . Worried About Charity fundraiser in the Last Year: Never true  . Ran Out of Food in the Last Year: Never  true  Transportation Needs: No Transportation Needs  . Lack of Transportation (Medical): No  . Lack of Transportation (Non-Medical): No  Physical Activity: Insufficiently Active  . Days of Exercise per Week: 5 days  . Minutes of Exercise per Session: 10 min  Stress: No Stress Concern Present  . Feeling of Stress : Not at all  Social Connections: Moderately Integrated  . Frequency of Communication with Friends and Family: More than three times a week  . Frequency of Social Gatherings with Friends and Family: Once a week  . Attends Religious Services: More than 4 times per year  . Active Member of Clubs or Organizations: No  . Attends Archivist Meetings: Never  . Marital Status: Married  Human resources officer Violence: Not At Risk  . Fear of Current or Ex-Partner: No  . Emotionally Abused: No  . Physically Abused: No  . Sexually Abused: No     Current Outpatient Medications:  .  meloxicam (MOBIC) 15 MG tablet, Take 1 tablet (15 mg total) by mouth daily., Disp: 90 tablet, Rfl: 1 .  nortriptyline (PAMELOR) 25 MG capsule, Take 1 capsule (25 mg total) by mouth at  bedtime., Disp: 90 capsule, Rfl: 1  Allergies  Allergen Reactions  . Penicillin G Other (See Comments)    Gets sick-vomiting     ROS  Constitutional: Negative for fever or weight change.  Respiratory: Negative for cough and shortness of breath.   Cardiovascular: Negative for chest pain or palpitations.  Gastrointestinal: Negative for abdominal pain, no bowel changes.  Musculoskeletal: Negative for gait problem or joint swelling.  Skin: Negative for rash.  Neurological: Negative for dizziness or headache.  No other specific complaints in a complete review of systems (except as listed in HPI above).   Objective  Vitals:   04/11/20 0824  BP: 120/83  Pulse: 83  Resp: 16  Temp: 98.4 F (36.9 C)  TempSrc: Oral  SpO2: 98%  Weight: 140 lb 14.4 oz (63.9 kg)  Height: 5\' 3"  (1.6 m)    Body mass index is  24.96 kg/m.  Physical Exam  Constitutional: Patient appears well-developed and well-nourished. No distress.  HENT: Head: Normocephalic and atraumatic. Ears: B TMs ok, no erythema or effusion; Nose: not done  Mouth/Throat: not done  Eyes: Conjunctivae and EOM are normal. Pupils are equal, round, and reactive to light. No scleral icterus.  Neck: Normal range of motion. Neck supple. No JVD present. No thyromegaly present.  Cardiovascular: Normal rate, regular rhythm and normal heart sounds.  No murmur heard. No BLE edema. Pulmonary/Chest: Effort normal and breath sounds normal. No respiratory distress. Abdominal: Soft. Bowel sounds are normal, no distension. There is no tenderness. no masses MALE GENITALIA: Normal descended testes bilaterally, no masses palpated, no hernias, no lesions, no discharge RECTAL:not done  Musculoskeletal: Normal range of motion, no joint effusions. No gross deformities Neurological: he is alert and oriented to person, place, and time. No cranial nerve deficit. Coordination, balance, strength, speech and gait are normal.  Skin: Skin is warm and dry. No rash noted. No erythema.  Psychiatric: Patient has a normal mood and affect. behavior is normal. Judgment and thought content normal.  Fall Risk: Fall Risk  04/11/2020 10/03/2019 03/24/2019 09/19/2018 03/03/2018  Falls in the past year? 0 0 0 0 No  Number falls in past yr: 0 0 0 0 -  Injury with Fall? 0 0 0 0 -  Follow up - Falls evaluation completed - - -     Functional Status Survey: Is the patient deaf or have difficulty hearing?: Yes Does the patient have difficulty seeing, even when wearing glasses/contacts?: No Does the patient have difficulty concentrating, remembering, or making decisions?: No Does the patient have difficulty walking or climbing stairs?: Yes Does the patient have difficulty dressing or bathing?: No Does the patient have difficulty doing errands alone such as visiting a doctor's office or  shopping?: No    Assessment & Plan  1. Need for Tdap vaccination  - Tdap vaccine greater than or equal to 7yo IM  2. Encounter for screening for HIV  - HIV Antibody (routine testing w rflx)  3. Vitamin D deficiency  - VITAMIN D 25 Hydroxy (Vit-D Deficiency, Fractures)  4. Pure hypercholesterolemia  - Lipid panel - Comprehensive metabolic panel  5. Gastroesophageal reflux disease without esophagitis  Taking prn medication  6. Hyperglycemia  - Hemoglobin A1c  7. Asthma, mild intermittent, well-controlled  Ventolin   8. Chronic neck pain  Continue medication  9. Long-term use of high-risk medication  - CBC with Differential/Platelet - Comprehensive metabolic panel  10. Well adult exam   11. Need for shingles vaccine  - Varicella-zoster vaccine  IM   -Prostate cancer screening and PSA options (with potential risks and benefits of testing vs not testing) were discussed along with recent recs/guidelines. -USPSTF grade A and B recommendations reviewed with patient; age-appropriate recommendations, preventive care, screening tests, etc discussed and encouraged; healthy living encouraged; see AVS for patient education given to patient -Discussed importance of 150 minutes of physical activity weekly, eat two servings of fish weekly, eat one serving of tree nuts ( cashews, pistachios, pecans, almonds.Marland Kitchen) every other day, eat 6 servings of fruit/vegetables daily and drink plenty of water and avoid sweet beverages.

## 2020-04-11 ENCOUNTER — Ambulatory Visit (INDEPENDENT_AMBULATORY_CARE_PROVIDER_SITE_OTHER): Payer: BC Managed Care – PPO | Admitting: Family Medicine

## 2020-04-11 ENCOUNTER — Encounter: Payer: Self-pay | Admitting: Family Medicine

## 2020-04-11 ENCOUNTER — Other Ambulatory Visit: Payer: Self-pay

## 2020-04-11 ENCOUNTER — Other Ambulatory Visit: Payer: Self-pay | Admitting: Family Medicine

## 2020-04-11 VITALS — BP 120/83 | HR 83 | Temp 98.4°F | Resp 16 | Ht 63.0 in | Wt 140.9 lb

## 2020-04-11 DIAGNOSIS — J4520 Mild intermittent asthma, uncomplicated: Secondary | ICD-10-CM

## 2020-04-11 DIAGNOSIS — E559 Vitamin D deficiency, unspecified: Secondary | ICD-10-CM | POA: Diagnosis not present

## 2020-04-11 DIAGNOSIS — M542 Cervicalgia: Secondary | ICD-10-CM

## 2020-04-11 DIAGNOSIS — E7800 Pure hypercholesterolemia, unspecified: Secondary | ICD-10-CM

## 2020-04-11 DIAGNOSIS — R739 Hyperglycemia, unspecified: Secondary | ICD-10-CM

## 2020-04-11 DIAGNOSIS — Z23 Encounter for immunization: Secondary | ICD-10-CM

## 2020-04-11 DIAGNOSIS — Z0000 Encounter for general adult medical examination without abnormal findings: Secondary | ICD-10-CM | POA: Diagnosis not present

## 2020-04-11 DIAGNOSIS — Z79899 Other long term (current) drug therapy: Secondary | ICD-10-CM

## 2020-04-11 DIAGNOSIS — Z114 Encounter for screening for human immunodeficiency virus [HIV]: Secondary | ICD-10-CM

## 2020-04-11 DIAGNOSIS — K219 Gastro-esophageal reflux disease without esophagitis: Secondary | ICD-10-CM

## 2020-04-11 DIAGNOSIS — G4489 Other headache syndrome: Secondary | ICD-10-CM

## 2020-04-11 DIAGNOSIS — G8929 Other chronic pain: Secondary | ICD-10-CM

## 2020-04-11 MED ORDER — NORTRIPTYLINE HCL 25 MG PO CAPS: 25.0000 mg | ORAL_CAPSULE | Freq: Every day | ORAL | 1 refills | Status: DC

## 2020-04-11 MED ORDER — MELOXICAM 15 MG PO TABS: 15.0000 mg | ORAL_TABLET | Freq: Every day | ORAL | 1 refills | Status: DC

## 2020-04-11 MED ORDER — ALBUTEROL SULFATE HFA 108 (90 BASE) MCG/ACT IN AERS: 2.0000 | INHALATION_SPRAY | Freq: Four times a day (QID) | RESPIRATORY_TRACT | 0 refills | Status: AC | PRN

## 2020-04-11 NOTE — Patient Instructions (Signed)
Preventive Care 41-56 Years Old, Male Preventive care refers to lifestyle choices and visits with your health care provider that can promote health and wellness. This includes:  A yearly physical exam. This is also called an annual well check.  Regular dental and eye exams.  Immunizations.  Screening for certain conditions.  Healthy lifestyle choices, such as eating a healthy diet, getting regular exercise, not using drugs or products that contain nicotine and tobacco, and limiting alcohol use. What can I expect for my preventive care visit? Physical exam Your health care provider will check:  Height and weight. These may be used to calculate body mass index (BMI), which is a measurement that tells if you are at a healthy weight.  Heart rate and blood pressure.  Your skin for abnormal spots. Counseling Your health care provider may ask you questions about:  Alcohol, tobacco, and drug use.  Emotional well-being.  Home and relationship well-being.  Sexual activity.  Eating habits.  Work and work Statistician. What immunizations do I need?  Influenza (flu) vaccine  This is recommended every year. Tetanus, diphtheria, and pertussis (Tdap) vaccine  You may need a Td booster every 10 years. Varicella (chickenpox) vaccine  You may need this vaccine if you have not already been vaccinated. Zoster (shingles) vaccine  You may need this after age 64. Measles, mumps, and rubella (MMR) vaccine  You may need at least one dose of MMR if you were born in 1957 or later. You may also need a second dose. Pneumococcal conjugate (PCV13) vaccine  You may need this if you have certain conditions and were not previously vaccinated. Pneumococcal polysaccharide (PPSV23) vaccine  You may need one or two doses if you smoke cigarettes or if you have certain conditions. Meningococcal conjugate (MenACWY) vaccine  You may need this if you have certain conditions. Hepatitis A  vaccine  You may need this if you have certain conditions or if you travel or work in places where you may be exposed to hepatitis A. Hepatitis B vaccine  You may need this if you have certain conditions or if you travel or work in places where you may be exposed to hepatitis B. Haemophilus influenzae type b (Hib) vaccine  You may need this if you have certain risk factors. Human papillomavirus (HPV) vaccine  If recommended by your health care provider, you may need three doses over 6 months. You may receive vaccines as individual doses or as more than one vaccine together in one shot (combination vaccines). Talk with your health care provider about the risks and benefits of combination vaccines. What tests do I need? Blood tests  Lipid and cholesterol levels. These may be checked every 5 years, or more frequently if you are over 60 years old.  Hepatitis C test.  Hepatitis B test. Screening  Lung cancer screening. You may have this screening every year starting at age 43 if you have a 30-pack-year history of smoking and currently smoke or have quit within the past 15 years.  Prostate cancer screening. Recommendations will vary depending on your family history and other risks.  Colorectal cancer screening. All adults should have this screening starting at age 72 and continuing until age 2. Your health care provider may recommend screening at age 14 if you are at increased risk. You will have tests every 1-10 years, depending on your results and the type of screening test.  Diabetes screening. This is done by checking your blood sugar (glucose) after you have not eaten  for a while (fasting). You may have this done every 1-3 years.  Sexually transmitted disease (STD) testing. Follow these instructions at home: Eating and drinking  Eat a diet that includes fresh fruits and vegetables, whole grains, lean protein, and low-fat dairy products.  Take vitamin and mineral supplements as  recommended by your health care provider.  Do not drink alcohol if your health care provider tells you not to drink.  If you drink alcohol: ? Limit how much you have to 0-2 drinks a day. ? Be aware of how much alcohol is in your drink. In the [place] one drink equals one 12 oz bottle of beer (355 mL), one 5 oz glass of wine (148 mL), or one 1 oz glass of hard liquor (44 mL). Lifestyle  Take daily care of your teeth and gums.  Stay active. Exercise for at least 30 minutes on 5 or more days each week.  Do not use any products that contain nicotine or tobacco, such as cigarettes, e-cigarettes, and chewing tobacco. If you need help quitting, ask your health care provider.  If you are sexually active, practice safe sex. Use a condom or other form of protection to prevent STIs (sexually transmitted infections).  Talk with your health care provider about taking a low-dose aspirin every day starting at age 53. What's next?  Go to your health care provider once a year for a well check visit.  Ask your health care provider how often you should have your eyes and teeth checked.  Stay up to date on all vaccines. This information is not intended to replace advice given to you by your health care provider. Make sure you discuss any questions you have with your health care provider. Document Revised: 06/23/2018 Document Reviewed: 06/23/2018 Elsevier Patient Education  2020 [employer].

## 2020-04-12 LAB — HGB A1C W/O EAG: Hgb A1c MFr Bld: 5.4 % (ref 4.8–5.6)

## 2020-04-12 LAB — CBC WITH DIFFERENTIAL/PLATELET
Basophils Absolute: 0 10*3/uL (ref 0.0–0.2)
Basos: 1 %
EOS (ABSOLUTE): 0.1 10*3/uL (ref 0.0–0.4)
Eos: 2 %
Hematocrit: 42.2 % (ref 37.5–51.0)
Hemoglobin: 14.6 g/dL (ref 13.0–17.7)
Immature Grans (Abs): 0 10*3/uL (ref 0.0–0.1)
Immature Granulocytes: 0 %
Lymphocytes Absolute: 2.1 10*3/uL (ref 0.7–3.1)
Lymphs: 38 %
MCH: 31.7 pg (ref 26.6–33.0)
MCHC: 34.6 g/dL (ref 31.5–35.7)
MCV: 92 fL (ref 79–97)
Monocytes Absolute: 0.5 10*3/uL (ref 0.1–0.9)
Monocytes: 9 %
Neutrophils Absolute: 2.8 10*3/uL (ref 1.4–7.0)
Neutrophils: 50 %
Platelets: 338 10*3/uL (ref 150–450)
RBC: 4.61 x10E6/uL (ref 4.14–5.80)
RDW: 11.8 % (ref 11.6–15.4)
WBC: 5.5 10*3/uL (ref 3.4–10.8)

## 2020-04-12 LAB — LIPID PANEL W/O CHOL/HDL RATIO
Cholesterol, Total: 240 mg/dL — ABNORMAL HIGH (ref 100–199)
HDL: 43 mg/dL (ref >39)
LDL Chol Calc (NIH): 177 mg/dL — ABNORMAL HIGH (ref 0–99)
Triglycerides: 111 mg/dL (ref 0–149)
VLDL Cholesterol Cal: 20 mg/dL (ref 5–40)

## 2020-04-12 LAB — COMPREHENSIVE METABOLIC PANEL
ALT: 21 IU/L (ref 0–44)
AST: 17 IU/L (ref 0–40)
Albumin/Globulin Ratio: 2.2 (ref 1.2–2.2)
Albumin: 4.7 g/dL (ref 3.8–4.9)
Alkaline Phosphatase: 69 IU/L (ref 44–121)
BUN/Creatinine Ratio: 18 (ref 9–20)
BUN: 20 mg/dL (ref 6–24)
Bilirubin Total: 0.3 mg/dL (ref 0.0–1.2)
CO2: 24 mmol/L (ref 20–29)
Calcium: 9.3 mg/dL (ref 8.7–10.2)
Chloride: 105 mmol/L (ref 96–106)
Creatinine, Ser: 1.1 mg/dL (ref 0.76–1.27)
GFR calc Af Amer: 86 mL/min/{1.73_m2} (ref >59)
GFR calc non Af Amer: 75 mL/min/{1.73_m2} (ref >59)
Globulin, Total: 2.1 g/dL (ref 1.5–4.5)
Glucose: 93 mg/dL (ref 65–99)
Potassium: 4.4 mmol/L (ref 3.5–5.2)
Sodium: 140 mmol/L (ref 134–144)
Total Protein: 6.8 g/dL (ref 6.0–8.5)

## 2020-04-12 LAB — HIV ANTIBODY (ROUTINE TESTING W REFLEX): HIV Screen 4th Generation wRfx: NONREACTIVE

## 2020-04-12 LAB — VITAMIN D 25 HYDROXY (VIT D DEFICIENCY, FRACTURES): Vit D, 25-Hydroxy: 27.3 ng/mL — ABNORMAL LOW (ref 30.0–100.0)

## 2020-06-11 ENCOUNTER — Other Ambulatory Visit: Payer: Self-pay

## 2020-06-11 ENCOUNTER — Ambulatory Visit: Payer: BC Managed Care – PPO

## 2020-06-11 DIAGNOSIS — Z23 Encounter for immunization: Secondary | ICD-10-CM

## 2020-06-29 ENCOUNTER — Other Ambulatory Visit: Payer: Self-pay | Admitting: Family Medicine

## 2020-06-29 DIAGNOSIS — G4489 Other headache syndrome: Secondary | ICD-10-CM

## 2020-06-29 NOTE — Telephone Encounter (Signed)
Requested Prescriptions  Pending Prescriptions Disp Refills  . nortriptyline (PAMELOR) 25 MG capsule [Pharmacy Med Name: NORTRIPTYLINE HCL 25 MG CAP] 90 capsule 2    Sig: TAKE 1 CAPSULE (25 MG TOTAL) BY MOUTH AT BEDTIME.     Psychiatry:  Antidepressants - Heterocyclics (TCAs) Passed - 06/29/2020  2:30 PM      Passed - Valid encounter within last 6 months    Recent Outpatient Visits          2 months ago Asthma, mild intermittent, well-controlled   [facility], Drue Stager, MD   9 months ago Chronic left-sided low back pain without sciatica   [facility] Steele Sizer, MD   1 year ago Asthma, mild intermittent, well-controlled   [facility] Steele Sizer, MD   1 year ago Cervical pain   [facility] Steele Sizer, MD   2 years ago Encounter for routine history and physical exam for male   [facility] Steele Sizer, MD      Future Appointments            In 3 months Ancil Boozer, Drue Stager, MD [facility], [facility]

## 2020-07-27 ENCOUNTER — Other Ambulatory Visit: Payer: Self-pay | Admitting: Family Medicine

## 2020-07-27 DIAGNOSIS — G4489 Other headache syndrome: Secondary | ICD-10-CM

## 2020-10-04 NOTE — Progress Notes (Deleted)
Name: James Roman   MRN: [id number]    DOB: 1965-11-24   Date:10/04/2020       Progress Note  Subjective  Chief Complaint  Follow up   HPI Hyperglycemia: last hgbA1C was normaldone 08/2018 at the [facility] and it was 5.6%, previously up to 5.8%, he denies polyphagia, polydipsia or polyuria.We will recheck it today   Vitamin D deficiency:he has not been taking vitamin D supplementationDiscussed rx vitamin D but he is not interested either . Discussed importance of resuming supplementation   Hyperlipidemia: not taking statin therapy,last LDL was149but low ASCVD, no chest pain or palpitation, bp is at goal . We will continue to monitor. It showed an improvement LDL was 155 down to 149 and HDL went up from 41 to 49 The 10-year ASCVD risk score Mikey Bussing DC Brooke Bonito., et al., 2013) is: 6.5%*   Values used to calculate the score:     Age: 55 years     Sex: Male     Is Non-Hispanic African American: No     Diabetic: No     Tobacco smoker: No     Systolic Blood Pressure: 956 mmHg     Is BP treated: No     HDL Cholesterol: 49 mg/dL*     Total Cholesterol: 229 mg/dL*     * - Cholesterol units were assumed for this score calculation   Neck pain: doing well on Meloxicam daily, also takes nortriptyline before bed and seems to help. Neck pop,  pain is described as dull nuchal pain but nothing recently, doing well on medication. He states since job change over 12  months ago he is doing well.  Asthma: doing well, no cough, wheezing or SOB, he did not get flu vaccine but is willing to get COVID-19 vaccine  GERD: doing well at this time, no heartburn or reflux at this time, he has occasional regurgitation and takes otc medication prn only . Unchanged   Chronic Right knee pain: he states back in Nov he had a limp because of pain, no redness or increase in warmth, no effusion. Otherwise it is a 1-2/10 worse at work better when resting. Going up steps also aggravates , symptoms started in 1988  while in the [employer] , had some PT and medication and symptoms resolved, but continues to have problem since  *** Patient Active Problem List   Diagnosis Date Noted  . Chronic left-sided low back pain without sciatica 09/19/2018  . Right knee pain 09/19/2018  . Vitamin D deficiency 09/04/2016  . Low serum phosphorus for age 55/23/2018  . Hyperglycemia 04/21/2015  . Hyperlipidemia 04/18/2015  . Asthma, mild intermittent, well-controlled 04/18/2015  . GERD (gastroesophageal reflux disease) 04/18/2015  . Cephalalgia 10/04/2014  . Cervical pain 10/04/2014    No past surgical history on file.  Family History  Problem Relation Age of Onset  . Cancer Mother   . Hearing loss Father   . Hyperlipidemia Father   . Skin cancer Father        Back  . Cancer Sister        Skin  . Anemia Daughter     Social History   Tobacco Use  . Smoking status: Never Smoker  . Smokeless tobacco: Never Used  Substance Use Topics  . Alcohol use: No    Alcohol/week: 0.0 standard drinks     Current Outpatient Medications:  .  albuterol (VENTOLIN HFA) 108 (90 Base) MCG/ACT inhaler, Inhale 2 puffs into the lungs every 6 (six)  hours as needed for wheezing or shortness of breath., Disp: 8 g, Rfl: 0 .  meloxicam (MOBIC) 15 MG tablet, Take 1 tablet (15 mg total) by mouth daily., Disp: 90 tablet, Rfl: 1 .  nortriptyline (PAMELOR) 25 MG capsule, TAKE 1 CAPSULE (25 MG TOTAL) BY MOUTH AT BEDTIME., Disp: 90 capsule, Rfl: 2  Allergies  Allergen Reactions  . Penicillin G Other (See Comments)    Gets sick-vomiting    I personally reviewed {Reviewed:14835} with the patient/caregiver today.   ROS  ***  Objective  There were no vitals filed for this visit.  There is no height or weight on file to calculate BMI.  Physical Exam ***  No results found for this or any previous visit (from the past 2160 hour(s)).  Diabetic Foot Exam: Diabetic Foot Exam - Simple   No data filed     ***  PHQ2/9: Depression screen [facility] 2/9 04/11/2020 10/03/2019 03/24/2019 09/19/2018 03/03/2018  Decreased Interest 0 0 0 0 0  Down, Depressed, Hopeless 0 0 0 0 0  PHQ - 2 Score 0 0 0 0 0  Altered sleeping - 2 0 0 0  Tired, decreased energy - 0 1 0 0  Change in appetite - 0 0 0 0  Feeling bad or failure about yourself  - 0 0 0 0  Trouble concentrating - 0 0 0 0  Moving slowly or fidgety/restless - 0 0 0 0  Suicidal thoughts - 0 0 0 0  PHQ-9 Score - 2 1 0 0  Difficult doing work/chores - Not difficult at all Not difficult at all - Not difficult at all    phq 9 is {gen pos [id number]} ***  Fall Risk: Fall Risk  04/11/2020 10/03/2019 03/24/2019 09/19/2018 03/03/2018  Falls in the past year? 0 0 0 0 No  Number falls in past yr: 0 0 0 0 -  Injury with Fall? 0 0 0 0 -  Follow up - Falls evaluation completed - - -   ***   Functional Status Survey:   ***   Assessment & Plan  *** There are no diagnoses linked to this encounter.

## 2020-10-07 ENCOUNTER — Ambulatory Visit: Payer: BC Managed Care – PPO | Admitting: Family Medicine

## 2020-10-14 DIAGNOSIS — M542 Cervicalgia: Secondary | ICD-10-CM | POA: Diagnosis not present

## 2020-10-15 ENCOUNTER — Ambulatory Visit: Payer: Self-pay

## 2020-10-15 DIAGNOSIS — B349 Viral infection, unspecified: Secondary | ICD-10-CM | POA: Diagnosis not present

## 2020-10-15 DIAGNOSIS — J069 Acute upper respiratory infection, unspecified: Secondary | ICD-10-CM | POA: Diagnosis not present

## 2020-10-15 DIAGNOSIS — M791 Myalgia, unspecified site: Secondary | ICD-10-CM | POA: Diagnosis not present

## 2020-10-15 DIAGNOSIS — R070 Pain in throat: Secondary | ICD-10-CM | POA: Diagnosis not present

## 2020-10-15 NOTE — Telephone Encounter (Signed)
Pt. Reports he has started having sinus symptoms and sore throat Friday. Saturday started having diarrhea. Has not tested for COVID 19.No availability in the practice today. Pt. Will go to UC.  Reason for Disposition . [1] SEVERE diarrhea (e.g., 7 or more times / day more than normal) AND [2] present > 24 hours (1 day)  Answer Assessment - Initial Assessment Questions 1. DIARRHEA SEVERITY: "How bad is the diarrhea?" "How many extra stools have you had in the past 24 hours than normal?"    - NO DIARRHEA (SCALE 0)   - MILD (SCALE 1-3): Few loose or mushy BMs; increase of 1-3 stools over normal daily number of stools; mild increase in ostomy output.   -  MODERATE (SCALE 4-7): Increase of 4-6 stools daily over normal; moderate increase in ostomy output. * SEVERE (SCALE 8-10; OR 'WORST POSSIBLE'): Increase of 7 or more stools daily over normal; moderate increase in ostomy output; incontinence.     8 2. ONSET: "When did the diarrhea begin?"      Saturday 3. BM CONSISTENCY: "How loose or watery is the diarrhea?"      Watery 4. VOMITING: "Are you also vomiting?" If Yes, ask: "How many times in the past 24 hours?"      No 5. ABDOMINAL PAIN: "Are you having any abdominal pain?" If Yes, ask: "What does it feel like?" (e.g., crampy, dull, intermittent, constant)      No 6. ABDOMINAL PAIN SEVERITY: If present, ask: "How bad is the pain?"  (e.g., Scale 1-10; mild, moderate, or severe)   - MILD (1-3): doesn't interfere with normal activities, abdomen soft and not tender to touch    - MODERATE (4-7): interferes with normal activities or awakens from sleep, tender to touch    - SEVERE (8-10): excruciating pain, doubled over, unable to do any normal activities       No 7. ORAL INTAKE: If vomiting, "Have you been able to drink liquids?" "How much fluids have you had in the past 24 hours?"     Yes 8. HYDRATION: "Any signs of dehydration?" (e.g., dry mouth [not just dry lips], too weak to stand, dizziness, new  weight loss) "When did you last urinate?"     No 9. EXPOSURE: "Have you traveled to a foreign country recently?" "Have you been exposed to anyone with diarrhea?" "Could you have eaten any food that was spoiled?"     No 10. ANTIBIOTIC USE: "Are you taking antibiotics now or have you taken antibiotics in the past 2 months?"       No 11. OTHER SYMPTOMS: "Do you have any other symptoms?" (e.g., fever, blood in stool)       No 12. PREGNANCY: "Is there any chance you are pregnant?" "When was your last menstrual period?"       n/a  Protocols used: DIARRHEA-A-AH

## 2020-10-30 ENCOUNTER — Other Ambulatory Visit: Payer: Self-pay | Admitting: Family Medicine

## 2020-10-30 DIAGNOSIS — M542 Cervicalgia: Secondary | ICD-10-CM

## 2020-10-30 DIAGNOSIS — G8929 Other chronic pain: Secondary | ICD-10-CM

## 2020-10-30 NOTE — Telephone Encounter (Signed)
Requested Prescriptions  Pending Prescriptions Disp Refills  . meloxicam (MOBIC) 15 MG tablet [Pharmacy Med Name: MELOXICAM 15 MG TABLET] 90 tablet 0    Sig: TAKE 1 TABLET BY MOUTH EVERY DAY     Analgesics:  COX2 Inhibitors Passed - 10/30/2020  1:29 AM      Passed - HGB in normal range and within 360 days    Hemoglobin  Date Value Ref Range Status  04/11/2020 14.6 13.0 - 17.7 g/dL Final         Passed - Cr in normal range and within 360 days    Creat  Date Value Ref Range Status  10/03/2019 1.40 (H) 0.70 - 1.33 mg/dL Final    Comment:    For patients >35 years of age, the reference limit for Creatinine is approximately 13% higher for people identified as African-American. .    Creatinine, Ser  Date Value Ref Range Status  04/11/2020 1.10 0.76 - 1.27 mg/dL Final         Passed - Patient is not pregnant      Passed - Valid encounter within last 12 months    Recent Outpatient Visits          6 months ago Asthma, mild intermittent, well-controlled   [facility] [place], Drue Stager, MD   1 year ago Chronic left-sided low back pain without sciatica   [facility] Steele Sizer, MD   1 year ago Asthma, mild intermittent, well-controlled   [facility] Steele Sizer, MD   2 years ago Cervical pain   [facility] Steele Sizer, MD   2 years ago Encounter for routine history and physical exam for male   [facility] Steele Sizer, MD      Future Appointments            In 2 weeks Steele Sizer, MD [facility], [place]

## 2020-11-11 NOTE — Progress Notes (Signed)
Name: James Roman   MRN: [id number]    DOB: 08/05/1965   Date:11/13/2020       Progress Note  Subjective  Chief Complaint  Follow Up  HPI  Hyperglycemia: last hgbA1C was normal was 5.5 % , previously up to 5.8%, he denies polyphagia, polydipsia or polyuria  Vitamin D deficiency: last level was a little low, he is not taking supplements   Hyperlipidemia:last LDL was high, high risk of heart attacks and strokes, explained importance of starting statin therapy   The 10-year ASCVD risk score Mikey Bussing DC Jr., et al., 2013) is: 12.6%   Values used to calculate the score:     Age: 55 years     Sex: Male     Is Non-Hispanic African American: No     Diabetic: No     Tobacco smoker: Yes     Systolic Blood Pressure: 979 mmHg     Is BP treated: No     HDL Cholesterol: 43 mg/dL     Total Cholesterol: 240 mg/dL   Neck pain: improved since he changed jobs and no longer overusing right arm and shoulder. He stopped taking Nortriptyline since nuchal pain and headaches have improved with the change in position at work, he does more forward motion, instead of hammering all day. . No longer has tingling or numbness on hands  Asthma: doing well, no cough, wheezing or SOB, no longer exposed to strong odors at work.Discussed a rx of rescue inhaler to have at home Doing well even on high pollen count   GERD: doing well at this time, no heartburn or reflux at this time, he has occasional regurgitation and takes otc medication , avoiding food that triggers symptoms   OA right knee: seen by [facility] and had Xray that showed mild medial OA with narrowing of joint space, he is on Meloxicam and is getting PT that he is working on it at home. Pain is sharp and sometimes dull can be from 2-7 but not constant , denies associated redness or swelling.   Patient Active Problem List   Diagnosis Date Noted  . Chronic left-sided low back pain without sciatica 09/19/2018  . Right knee pain 09/19/2018  .  Vitamin D deficiency 09/04/2016  . Low serum phosphorus for age 55/23/2018  . Hyperglycemia 04/21/2015  . Hyperlipidemia 04/18/2015  . Asthma, mild intermittent, well-controlled 04/18/2015  . GERD (gastroesophageal reflux disease) 04/18/2015  . Cephalalgia 10/04/2014  . Cervical pain 10/04/2014    No past surgical history on file.  Family History  Problem Relation Age of Onset  . Cancer Mother   . Hearing loss Father   . Hyperlipidemia Father   . Skin cancer Father        Back  . Cancer Sister        Skin  . Anemia Daughter     Social History   Tobacco Use  . Smoking status: Never Smoker  . Smokeless tobacco: Never Used  Substance Use Topics  . Alcohol use: No    Alcohol/week: 0.0 standard drinks     Current Outpatient Medications:  .  meloxicam (MOBIC) 15 MG tablet, TAKE 1 TABLET BY MOUTH EVERY DAY, Disp: 90 tablet, Rfl: 0 .  albuterol (VENTOLIN HFA) 108 (90 Base) MCG/ACT inhaler, Inhale 2 puffs into the lungs every 6 (six) hours as needed for wheezing or shortness of breath. (Patient not taking: Reported on 11/13/2020), Disp: 8 g, Rfl: 0 .  nortriptyline (PAMELOR) 25 MG capsule, TAKE  1 CAPSULE (25 MG TOTAL) BY MOUTH AT BEDTIME. (Patient not taking: Reported on 11/13/2020), Disp: 90 capsule, Rfl: 2  Allergies  Allergen Reactions  . Penicillin G Other (See Comments)    Gets sick-vomiting    I personally reviewed active problem list, medication list, allergies, family history, social history, health maintenance with the patient/caregiver today.   ROS  Constitutional: Negative for fever or weight change.  Respiratory: Negative for cough and shortness of breath.   Cardiovascular: Negative for chest pain or palpitations.  Gastrointestinal: Negative for abdominal pain, no bowel changes.  Musculoskeletal: Negative for gait problem or joint swelling.  Skin: Negative for rash.  Neurological: Negative for dizziness or headache.  No other specific complaints in a complete  review of systems (except as listed in HPI above).  Objective  Vitals:   11/13/20 1053  BP: 112/70  Pulse: 74  Resp: 16  Temp: 98.1 F (36.7 C)  TempSrc: Oral  SpO2: 97%  Weight: 140 lb (63.5 kg)  Height: 5\' 3"  (1.6 m)    Body mass index is 24.8 kg/m.  Physical Exam  Constitutional: Patient appears well-developed and well-nourished. No distress.  HEENT: head atraumatic, normocephalic, pupils equal and reactive to light, neck supple Cardiovascular: Normal rate, regular rhythm and normal heart sounds.  No murmur heard. No BLE edema. Pulmonary/Chest: Effort normal and breath sounds normal. No respiratory distress. Abdominal: Soft.  There is no tenderness. Psychiatric: Patient has a normal mood and affect. behavior is normal. Judgment and thought content normal.  PHQ2/9: Depression screen [facility] 2/9 11/13/2020 04/11/2020 10/03/2019 03/24/2019 09/19/2018  Decreased Interest 0 0 0 0 0  Down, Depressed, Hopeless 0 0 0 0 0  PHQ - 2 Score 0 0 0 0 0  Altered sleeping - - 2 0 0  Tired, decreased energy - - 0 1 0  Change in appetite - - 0 0 0  Feeling bad or failure about yourself  - - 0 0 0  Trouble concentrating - - 0 0 0  Moving slowly or fidgety/restless - - 0 0 0  Suicidal thoughts - - 0 0 0  PHQ-9 Score - - 2 1 0  Difficult doing work/chores - - Not difficult at all Not difficult at all -    phq 9 is negative   Fall Risk: Fall Risk  11/13/2020 04/11/2020 10/03/2019 03/24/2019 09/19/2018  Falls in the past year? 0 0 0 0 0  Number falls in past yr: 0 0 0 0 0  Injury with Fall? 0 0 0 0 0  Follow up - - Falls evaluation completed - -     Functional Status Survey: Is the patient deaf or have difficulty hearing?: Yes Does the patient have difficulty seeing, even when wearing glasses/contacts?: No Does the patient have difficulty concentrating, remembering, or making decisions?: No Does the patient have difficulty walking or climbing stairs?: No Does the patient have difficulty  dressing or bathing?: No Does the patient have difficulty doing errands alone such as visiting a doctor's office or shopping?: No    Assessment & Plan  1. Vitamin D deficiency  Advised vitamin D otc   2. Pure hypercholesterolemia  Willing to start Crestor   3. Gastroesophageal reflux disease without esophagitis  Doing well at this time  4. Chronic neck pain  - meloxicam (MOBIC) 15 MG tablet; Take 1 tablet (15 mg total) by mouth daily.  Dispense: 90 tablet; Refill: 0  5. Asthma, mild intermittent, well-controlled  Doing well   6.  Hyperglycemia

## 2020-11-13 ENCOUNTER — Other Ambulatory Visit: Payer: Self-pay

## 2020-11-13 ENCOUNTER — Ambulatory Visit (INDEPENDENT_AMBULATORY_CARE_PROVIDER_SITE_OTHER): Payer: BC Managed Care – PPO | Admitting: Family Medicine

## 2020-11-13 ENCOUNTER — Encounter: Payer: Self-pay | Admitting: Family Medicine

## 2020-11-13 VITALS — BP 112/70 | HR 74 | Temp 98.1°F | Resp 16 | Ht 63.0 in | Wt 140.0 lb

## 2020-11-13 DIAGNOSIS — E7800 Pure hypercholesterolemia, unspecified: Secondary | ICD-10-CM | POA: Diagnosis not present

## 2020-11-13 DIAGNOSIS — M542 Cervicalgia: Secondary | ICD-10-CM | POA: Diagnosis not present

## 2020-11-13 DIAGNOSIS — E559 Vitamin D deficiency, unspecified: Secondary | ICD-10-CM

## 2020-11-13 DIAGNOSIS — M25561 Pain in right knee: Secondary | ICD-10-CM

## 2020-11-13 DIAGNOSIS — G8929 Other chronic pain: Secondary | ICD-10-CM

## 2020-11-13 DIAGNOSIS — J4520 Mild intermittent asthma, uncomplicated: Secondary | ICD-10-CM

## 2020-11-13 DIAGNOSIS — K219 Gastro-esophageal reflux disease without esophagitis: Secondary | ICD-10-CM

## 2020-11-13 DIAGNOSIS — R739 Hyperglycemia, unspecified: Secondary | ICD-10-CM

## 2020-11-13 MED ORDER — MELOXICAM 15 MG PO TABS: 1.0000 | ORAL_TABLET | Freq: Every day | ORAL | 0 refills | Status: DC

## 2020-11-13 MED ORDER — ROSUVASTATIN CALCIUM 10 MG PO TABS: 10.0000 mg | ORAL_TABLET | Freq: Every day | ORAL | 1 refills | Status: DC

## 2021-03-30 ENCOUNTER — Other Ambulatory Visit: Payer: Self-pay | Admitting: Family Medicine

## 2021-03-30 DIAGNOSIS — G8929 Other chronic pain: Secondary | ICD-10-CM

## 2021-03-30 DIAGNOSIS — M542 Cervicalgia: Secondary | ICD-10-CM

## 2021-04-02 DIAGNOSIS — M25561 Pain in right knee: Secondary | ICD-10-CM | POA: Diagnosis not present

## 2021-04-02 DIAGNOSIS — E785 Hyperlipidemia, unspecified: Secondary | ICD-10-CM | POA: Diagnosis not present

## 2021-04-02 DIAGNOSIS — L989 Disorder of the skin and subcutaneous tissue, unspecified: Secondary | ICD-10-CM | POA: Diagnosis not present

## 2021-04-02 DIAGNOSIS — R519 Headache, unspecified: Secondary | ICD-10-CM | POA: Diagnosis not present

## 2021-04-02 LAB — LIPID PANEL
Cholesterol: 207 — AB (ref 0–200)
HDL: 45 (ref 35–70)
LDL Cholesterol: 136
Triglycerides: 244 — AB (ref 40–160)

## 2021-04-15 NOTE — Patient Instructions (Signed)
Preventive Care 40-57 Years Old, Male Preventive care refers to lifestyle choices and visits with your health care provider that can promote health and wellness. This includes: A yearly physical exam. This is also called an annual wellness visit. Regular dental and eye exams. Immunizations. Screening for certain conditions. Healthy lifestyle choices, such as: Eating a healthy diet. Getting regular exercise. Not using drugs or products that contain nicotine and tobacco. Limiting alcohol use. What can I expect for my preventive care visit? Physical exam Your health care provider will check your: Height and weight. These may be used to calculate your BMI (body mass index). BMI is a measurement that tells if you are at a healthy weight. Heart rate and blood pressure. Body temperature. Skin for abnormal spots. Counseling Your health care provider may ask you questions about your: Past medical problems. Family's medical history. Alcohol, tobacco, and drug use. Emotional well-being. Home life and relationship well-being. Sexual activity. Diet, exercise, and sleep habits. Work and work environment. Access to firearms. What immunizations do I need? Vaccines are usually given at various ages, according to a schedule. Your health care provider will recommend vaccines for you based on your age, medical history, and lifestyle or other factors, such as travel or where you work. What tests do I need? Blood tests Lipid and cholesterol levels. These may be checked every 5 years, or more often if you are over 57 years old. Hepatitis C test. Hepatitis B test. Screening Lung cancer screening. You may have this screening every year starting at age 57 if you have a 30-pack-year history of smoking and currently smoke or have quit within the past 15 years. Prostate cancer screening. Recommendations will vary depending on your family history and other risks. Genital exam to check for testicular cancer  or hernias. Colorectal cancer screening. All adults should have this screening starting at age 57 and continuing until age 75. Your health care provider may recommend screening at age 57 if you are at increased risk. You will have tests every 1-10 years, depending on your results and the type of screening test. Diabetes screening. This is done by checking your blood sugar (glucose) after you have not eaten for a while (fasting). You may have this done every 1-3 years. STD (sexually transmitted disease) testing, if you are at risk. Follow these instructions at home: Eating and drinking  Eat a diet that includes fresh fruits and vegetables, whole grains, lean protein, and low-fat dairy products. Take vitamin and mineral supplements as recommended by your health care provider. Do not drink alcohol if your health care provider tells you not to drink. If you drink alcohol: Limit how much you have to 0-2 drinks a day. Be aware of how much alcohol is in your drink. In the [place] one drink equals one 12 oz bottle of beer (355 mL), one 5 oz glass of wine (148 mL), or one 1 oz glass of hard liquor (44 mL). Lifestyle Take daily care of your teeth and gums. Brush your teeth every morning and night with fluoride toothpaste. Floss one time each day. Stay active. Exercise for at least 30 minutes 5 or more days each week. Do not use any products that contain nicotine or tobacco, such as cigarettes, e-cigarettes, and chewing tobacco. If you need help quitting, ask your health care provider. Do not use drugs. If you are sexually active, practice safe sex. Use a condom or other form of protection to prevent STIs (sexually transmitted infections). If told by your   health care provider, take low-dose aspirin daily starting at age 57. Find healthy ways to cope with stress, such as: Meditation, yoga, or listening to music. Journaling. Talking to a trusted person. Spending time with friends and  family. Safety Always wear your seat belt while driving or riding in a vehicle. Do not drive: If you have been drinking alcohol. Do not ride with someone who has been drinking. When you are tired or distracted. While texting. Wear a helmet and other protective equipment during sports activities. If you have firearms in your house, make sure you follow all gun safety procedures. What's next? Go to your health care provider once a year for an annual wellness visit. Ask your health care provider how often you should have your eyes and teeth checked. Stay up to date on all vaccines. This information is not intended to replace advice given to you by your health care provider. Make sure you discuss any questions you have with your health care provider. Document Revised: 09/06/2020 Document Reviewed: 06/23/2018 Elsevier Patient Education  2022 Elsevier Inc.   

## 2021-04-15 NOTE — Progress Notes (Signed)
Name: James Roman   MRN: [id number]    DOB: 18-Sep-1965   Date:04/16/2021       Progress Note  Subjective  Chief Complaint  Annual Exam  HPI  Patient presents for annual CPE and follow up  Hyperglycemia: last hgbA1C was normal was 5.5 % , previously up to 5.8%, he denies polyphagia, polydipsia or polyuria. Fasting glucose normal at the [place]   Vitamin D deficiency: he is not taking supplementation    Hyperlipidemia: we gave him Rosuvastatin in May but he never took medication, his LDL was done at the [place] in September and down to 137. He states they will do a cardiac calcium score soon.His risk is down from 12.6 % to 10.5 % He prefers not starting cholesterol medication at this time   The 10-year ASCVD risk score (Arnett DK, et al., 2019) is: 10.5%*   Values used to calculate the score:     Age: 55 years     Sex: Male     Is Non-Hispanic African American: No     Diabetic: No     Tobacco smoker: Yes     Systolic Blood Pressure: 354 mmHg     Is BP treated: No     HDL Cholesterol: 45 mg/dL*     Total Cholesterol: 207 mg/dL*     * - Cholesterol units were assumed for this score calculation      Neck pain:  No longer has tingling or numbness on hands and has been off Nortriptyline , but still takes Meloxicam daily but states his neck is not much of a problem lately. He still has headache but down to once a week and stress related    Asthma: doing well, no cough, wheezing or SOB, he has albuterol but never uses it    GERD: doing well at this time, no heartburn or indigestion. Doing well, off medication    OA right knee: seen by [facility] and had Xray that showed mild medial OA with narrowing of joint space, he is on Meloxicam , he uses exercise bands at home, he states feels achy on his joints at the end of the day, no effusion or redness or increase in warmth    IPSS Questionnaire (AUA-7): Over the past month.   1)  How often have you had a sensation of not emptying your  bladder completely after you finish urinating?  0 - Not at all  2)  How often have you had to urinate again less than two hours after you finished urinating? 0 - Not at all  3)  How often have you found you stopped and started again several times when you urinated?  0 - Not at all  4) How difficult have you found it to postpone urination?  0 - Not at all  5) How often have you had a weak urinary stream?  0 - Not at all  6) How often have you had to push or strain to begin urination?  0 - None  7) How many times did you most typically get up to urinate from the time you went to bed until the time you got up in the morning?  0 - Not at all  Total score:  0-7 mildly symptomatic   8-19 moderately symptomatic   20-35 severely symptomatic     Diet: discussed balanced diet - he packs his food  Exercise: discussed regular physical activity   Depression: phq 9 is negative Depression screen [facility] 2/9  04/16/2021 11/13/2020 04/11/2020 10/03/2019 03/24/2019  Decreased Interest 0 0 0 0 0  Down, Depressed, Hopeless 0 0 0 0 0  PHQ - 2 Score 0 0 0 0 0  Altered sleeping - - - 2 0  Tired, decreased energy - - - 0 1  Change in appetite - - - 0 0  Feeling bad or failure about yourself  - - - 0 0  Trouble concentrating - - - 0 0  Moving slowly or fidgety/restless - - - 0 0  Suicidal thoughts - - - 0 0  PHQ-9 Score - - - 2 1  Difficult doing work/chores - - - Not difficult at all Not difficult at all    Hypertension:  BP Readings from Last 3 Encounters:  04/16/21 112/76  11/13/20 112/70  04/11/20 120/83    Obesity: Wt Readings from Last 3 Encounters:  04/16/21 137 lb (62.1 kg)  11/13/20 140 lb (63.5 kg)  04/11/20 140 lb 14.4 oz (63.9 kg)   BMI Readings from Last 3 Encounters:  04/16/21 24.27 kg/m  11/13/20 24.80 kg/m  04/11/20 24.96 kg/m     Lipids:  Lab Results  Component Value Date   CHOL 207 (A) 04/02/2021   CHOL 240 (H) 04/11/2020   CHOL 206 (H) 10/03/2019   Lab Results  Component  Value Date   HDL 45 04/02/2021   HDL 43 04/11/2020   HDL 34 (L) 10/03/2019   Lab Results  Component Value Date   LDLCALC 136 04/02/2021   LDLCALC 177 (H) 04/11/2020   LDLCALC 137 (H) 10/03/2019   Lab Results  Component Value Date   TRIG 244 (A) 04/02/2021   TRIG 111 04/11/2020   TRIG 211 (H) 10/03/2019   Lab Results  Component Value Date   CHOLHDL 6.1 (H) 10/03/2019   CHOLHDL 5.3 (H) 03/03/2018   CHOLHDL 5.0 (H) 02/26/2017   No results found for: LDLDIRECT Glucose:  Glucose  Date Value Ref Range Status  04/11/2020 93 65 - 99 mg/dL Final  04/13/2012 135 (H) 65 - 99 mg/dL Final   Glucose, Bld  Date Value Ref Range Status  10/03/2019 90 65 - 99 mg/dL Final    Comment:    .            Fasting reference interval .   03/03/2018 91 65 - 139 mg/dL Final    Comment:    .        Non-fasting reference interval .   02/26/2017 85 65 - 99 mg/dL Final    Flowsheet Row Office Visit from 04/16/2021 in [facility]  AUDIT-C Score 0       Married STD testing and prevention (HIV/chl/gon/syphilis): 04/11/20 Hep C: 03/23/12  Skin cancer: Discussed monitoring for atypical lesions Colorectal cancer: 05/25/14  Prostate cancer:  Lab Results  Component Value Date   PSA 0.69 08/19/2018   PSA 1.0 08/27/2016   PSA 1.1 normal 04/19/2014     Lung cancer: Low Dose CT Chest recommended if Age 35-80 years, 30 pack-year currently smoking OR have quit w/in 15years. Patient does not qualify.   AAA: The USPSTF recommends one-time screening with ultrasonography in men ages 63 to 83 years who have ever smoked ECG:  04/13/12  Vaccines:   Shingrix: up to date  Pneumonia: today  Flu: refused flu   Advanced Care Planning: A voluntary discussion about advance care planning including the explanation and discussion of advance directives.  Discussed health care proxy and Living will, and the  patient was able to identify a health care proxy as wife.   Patient Active  Problem List   Diagnosis Date Noted   Chronic left-sided low back pain without sciatica 09/19/2018   Right knee pain 09/19/2018   Vitamin D deficiency 09/04/2016   Low serum phosphorus for age 18/23/2018   Hyperglycemia 04/21/2015   Hyperlipidemia 04/18/2015   Asthma, mild intermittent, well-controlled 04/18/2015   GERD (gastroesophageal reflux disease) 04/18/2015   Cephalalgia 10/04/2014   Cervical pain 10/04/2014    No past surgical history on file.  Family History  Problem Relation Age of Onset   Cancer Mother    Hearing loss Father    Hyperlipidemia Father    Skin cancer Father        Back   Cancer Sister        Skin   Anemia Daughter     Social History   Socioeconomic History   Marital status: Married    Spouse name: Manuela Schwartz   Number of children: 2   Years of education: Not on file   Highest education level: High school graduate  Occupational History   Not on file  Tobacco Use   Smoking status: Never   Smokeless tobacco: Never  Vaping Use   Vaping Use: Never used  Substance and Sexual Activity   Alcohol use: No    Alcohol/week: 0.0 standard drinks   Drug use: No   Sexual activity: Yes    Partners: Female  Other Topics Concern   Not on file  Social History Narrative   Married , no children in the home    He has a daughter that was given for adoption as an  infant, they are connecting now    He also has another grown daughter that has two teenager children   Working at [employer] ( part of [employer])   Lead person - assembly line       Patient has 2 daughters & 3 step-daughters   3 grandkids      Social Determinants of Radio broadcast assistant Strain: Low Risk    Difficulty of Paying Living Expenses: Not hard at all  Food Insecurity: No Food Insecurity   Worried About Charity fundraiser in the Last Year: Never true   Arboriculturist in the Last Year: Never true  Transportation Needs: No Transportation Needs   Lack of Transportation (Medical):  No   Lack of Transportation (Non-Medical): No  Physical Activity: Inactive   Days of Exercise per Week: 0 days   Minutes of Exercise per Session: 0 min  Stress: Stress Concern Present   Feeling of Stress : To some extent  Social Connections: Moderately Integrated   Frequency of Communication with Friends and Family: More than three times a week   Frequency of Social Gatherings with Friends and Family: Twice a week   Attends Religious Services: More than 4 times per year   Active Member of [employer] or Organizations: No   Attends Music therapist: Never   Marital Status: Married  Human resources officer Violence: Not At Risk   Fear of Current or Ex-Partner: No   Emotionally Abused: No   Physically Abused: No   Sexually Abused: No     Current Outpatient Medications:    meloxicam (MOBIC) 15 MG tablet, TAKE 1 TABLET (15 MG TOTAL) BY MOUTH DAILY., Disp: 90 tablet, Rfl: 0   albuterol (VENTOLIN HFA) 108 (90 Base) MCG/ACT inhaler, Inhale 2 puffs into the lungs  every 6 (six) hours as needed for wheezing or shortness of breath. (Patient not taking: Reported on 04/16/2021), Disp: 8 g, Rfl: 0  Allergies  Allergen Reactions   Penicillin G Other (See Comments)    Gets sick-vomiting     ROS  Constitutional: Negative for fever or weight change.  Respiratory: Negative for cough and shortness of breath.   Cardiovascular: Negative for chest pain or palpitations.  Gastrointestinal: Negative for abdominal pain, no bowel changes.  Musculoskeletal: Negative for gait problem or joint swelling.  Skin: Negative for rash.  Neurological: Negative for dizziness or headache.  No other specific complaints in a complete review of systems (except as listed in HPI above).    Objective  Vitals:   04/16/21 0758  BP: 112/76  Pulse: 66  Resp: 16  Temp: 98.1 F (36.7 C)  SpO2: 98%  Weight: 137 lb (62.1 kg)  Height: 5\' 3"  (1.6 m)    Body mass index is 24.27 kg/m.  Physical  Exam  Constitutional: Patient appears well-developed and well-nourished. No distress.  HENT: Head: Normocephalic and atraumatic. Ears: B TMs ok, no erythema or effusion; Nose: Nose normal. Mouth/Throat: Oropharynx is clear and moist. No oropharyngeal exudate.  Eyes: Conjunctivae and EOM are normal. Pupils are equal, round, and reactive to light. No scleral icterus.  Neck: Normal range of motion. Neck supple. No JVD present. No thyromegaly present.  Cardiovascular: Normal rate, regular rhythm and normal heart sounds.  No murmur heard. No BLE edema. Pulmonary/Chest: Effort normal and breath sounds normal. No respiratory distress. Abdominal: Soft. Bowel sounds are normal, no distension. There is no tenderness. no masses MALE GENITALIA: Normal descended testes bilaterally, no masses palpated, no hernias, no lesions, no discharge RECTAL:not done  Musculoskeletal: Normal range of motion, no joint effusions. No gross deformities Neurological: he is alert and oriented to person, place, and time. No cranial nerve deficit. Coordination, balance, strength, speech and gait are normal.  Skin: Skin is warm and dry. No rash noted. No erythema.  Psychiatric: Patient has a normal mood and affect. behavior is normal. Judgment and thought content normal.   Recent Results (from the past 2160 hour(s))  Lipid panel     Status: Abnormal   Collection Time: 04/02/21 12:00 AM  Result Value Ref Range   Triglycerides 244 (A) 40 - 160   Cholesterol 207 (A) 0 - 200   HDL 45 35 - 70   LDL Cholesterol 136      Fall Risk: Fall Risk  04/16/2021 11/13/2020 04/11/2020 10/03/2019 03/24/2019  Falls in the past year? 1 0 0 0 0  Number falls in past yr: 1 0 0 0 0  Injury with Fall? 0 0 0 0 0  Risk for fall due to : No Fall Risks - - - -  Follow up Falls prevention discussed - - Falls evaluation completed -      Functional Status Survey: Is the patient deaf or have difficulty hearing?: Yes Does the patient have difficulty  seeing, even when wearing glasses/contacts?: No Does the patient have difficulty concentrating, remembering, or making decisions?: Yes Does the patient have difficulty walking or climbing stairs?: Yes Does the patient have difficulty dressing or bathing?: No Does the patient have difficulty doing errands alone such as visiting a doctor's office or shopping?: No    Assessment & Plan  1. Well adult exam   2. Pure hypercholesterolemia   3. Asthma, mild intermittent, well-controlled   4. Vitamin D deficiency   5. Chronic neck  pain  - meloxicam (MOBIC) 15 MG tablet; Take 1 tablet (15 mg total) by mouth daily.  Dispense: 90 tablet; Refill: 1  6. Chronic pain of right knee  - meloxicam (MOBIC) 15 MG tablet; Take 1 tablet (15 mg total) by mouth daily.  Dispense: 90 tablet; Refill: 1  7. Gastroesophageal reflux disease without esophagitis   8. Hyperglycemia   9. Need for vaccination for pneumococcus  - Pneumococcal conjugate vaccine 20-valent (Prevnar 20)   -Prostate cancer screening and PSA options (with potential risks and benefits of testing vs not testing) were discussed along with recent recs/guidelines. -USPSTF grade A and B recommendations reviewed with patient; age-appropriate recommendations, preventive care, screening tests, etc discussed and encouraged; healthy living encouraged; see AVS for patient education given to patient -Discussed importance of 150 minutes of physical activity weekly, eat two servings of fish weekly, eat one serving of tree nuts ( cashews, pistachios, pecans, almonds.Marland Kitchen) every other day, eat 6 servings of fruit/vegetables daily and drink plenty of water and avoid sweet beverages.

## 2021-04-16 ENCOUNTER — Other Ambulatory Visit: Payer: Self-pay

## 2021-04-16 ENCOUNTER — Encounter: Payer: Self-pay | Admitting: Family Medicine

## 2021-04-16 ENCOUNTER — Ambulatory Visit (INDEPENDENT_AMBULATORY_CARE_PROVIDER_SITE_OTHER): Payer: BC Managed Care – PPO | Admitting: Family Medicine

## 2021-04-16 VITALS — BP 112/76 | HR 66 | Temp 98.1°F | Resp 16 | Ht 63.0 in | Wt 137.0 lb

## 2021-04-16 DIAGNOSIS — E559 Vitamin D deficiency, unspecified: Secondary | ICD-10-CM | POA: Diagnosis not present

## 2021-04-16 DIAGNOSIS — Z23 Encounter for immunization: Secondary | ICD-10-CM

## 2021-04-16 DIAGNOSIS — G8929 Other chronic pain: Secondary | ICD-10-CM

## 2021-04-16 DIAGNOSIS — M25561 Pain in right knee: Secondary | ICD-10-CM

## 2021-04-16 DIAGNOSIS — J4520 Mild intermittent asthma, uncomplicated: Secondary | ICD-10-CM

## 2021-04-16 DIAGNOSIS — M542 Cervicalgia: Secondary | ICD-10-CM | POA: Diagnosis not present

## 2021-04-16 DIAGNOSIS — E7800 Pure hypercholesterolemia, unspecified: Secondary | ICD-10-CM

## 2021-04-16 DIAGNOSIS — Z0000 Encounter for general adult medical examination without abnormal findings: Secondary | ICD-10-CM

## 2021-04-16 DIAGNOSIS — R739 Hyperglycemia, unspecified: Secondary | ICD-10-CM

## 2021-04-16 DIAGNOSIS — K219 Gastro-esophageal reflux disease without esophagitis: Secondary | ICD-10-CM

## 2021-04-16 MED ORDER — MELOXICAM 15 MG PO TABS: 15.0000 mg | ORAL_TABLET | Freq: Every day | ORAL | 1 refills | Status: DC

## 2021-05-05 DIAGNOSIS — R918 Other nonspecific abnormal finding of lung field: Secondary | ICD-10-CM | POA: Diagnosis not present

## 2021-05-28 ENCOUNTER — Ambulatory Visit: Payer: BC Managed Care – PPO | Admitting: Family Medicine

## 2021-06-12 DIAGNOSIS — Z461 Encounter for fitting and adjustment of hearing aid: Secondary | ICD-10-CM | POA: Diagnosis not present

## 2021-10-13 NOTE — Progress Notes (Signed)
Name: James Roman   MRN: [id number]    DOB: [birth year]   Date:10/14/2021 ? ?     Progress Note ? ?Subjective ? ?Chief Complaint ? ?Follow Up ? ?HPI ? ?Hyperglycemia: last hgbA1C was normal was 5.5 % , previously up to 5.8%, he denies polyphagia, polydipsia or polyuria. He has been trying to cut down on carbs.  ?  ?Vitamin D deficiency: he is not taking supplementation , discussed taking at least 1000 mcg daily  ?  ?Hyperlipidemia: we gave him Rosuvastatin in May but he never took medication, his LDL was done at the [place] in September and down to 137. He states the a cardiac calcium score at the [place] and thinks it was normal . Discussed statin therapy again today He has some right side chest pain, he feels it was constant soreness for about 2 days after he was using a machine that is about 100 lbs that he had to push it . Not associated nausea, vomiting or diaphoresis, it resolved by itself.  ? ?The 10-year ASCVD risk score (Arnett DK, et al., 2019) is: 12.4%* ?  Values used to calculate the score: ?    Age: 56 years ?    Sex: Male ?    Is Non-Hispanic African American: No ?    Diabetic: No ?    Tobacco smoker: Yes ?    Systolic Blood Pressure: 417 mmHg ?    Is BP treated: No ?    HDL Cholesterol: 45 mg/dL* ?    Total Cholesterol: 207 mg/dL* ?    * - Cholesterol units were assumed for this score calculation  ?  ?  ?Neck pain:  No longer has tingling or numbness on hands and has been off Nortriptyline , but still takes Meloxicam daily , neck pain now is zero/10 but gets worse at work due to stress. Discussed muscle relaxer to help with symptoms.  ?  ?Asthma: doing well, no cough, wheezing or SOB, he has albuterol but never uses it . Unchanged  ? ?Fatigue: he works from 4 pm to 2 am, goes to sleep around 4-4:30 am, gets up at 7:15 am to take everyone to work, goes back to sleep at 10 am and wakes up at 3 pm . He is constantly tired, sleeps most of the weekend. Asked to be checked for Lyme disease, explained likely  fatigued from lack of sleep Denies lack of libido ?  ?GERD: doing well at this time, no heartburn or indigestion. He has intermittent episodes of night. Discussed raising the head of the bed  ?  ?OA right knee: seen by [facility] and had Xray that showed mild medial OA with narrowing of joint space, he is on Meloxicam , right now the pain is 5/10 , he uses exercise bands at home, he states feels achy on his joints at the end of the day, no effusion or redness or increase in warmth ?  ?Patient Active Problem List  ? Diagnosis Date Noted  ? Chronic left-sided low back pain without sciatica 09/19/2018  ? Right knee pain 09/19/2018  ? Vitamin D deficiency 09/04/2016  ? Low serum phosphorus for age 57/23/2018  ? Hyperglycemia 04/21/2015  ? Hyperlipidemia 04/18/2015  ? Asthma, mild intermittent, well-controlled 04/18/2015  ? GERD (gastroesophageal reflux disease) 04/18/2015  ? Cephalalgia 10/04/2014  ? Cervical pain 10/04/2014  ? ? ?No past surgical history on file. ? ?Family History  ?Problem Relation Age of Onset  ? Cancer Mother   ?  Hearing loss Father   ? Hyperlipidemia Father   ? Skin cancer Father   ?     Back  ? Cancer Sister   ?     Skin  ? Anemia Daughter   ? ? ?Social History  ? ?Tobacco Use  ? Smoking status: Never  ? Smokeless tobacco: Never  ?Substance Use Topics  ? Alcohol use: No  ?  Alcohol/week: 0.0 standard drinks  ? ? ? ?Current Outpatient Medications:  ?  meloxicam (MOBIC) 15 MG tablet, Take 1 tablet (15 mg total) by mouth daily., Disp: 90 tablet, Rfl: 1 ?  albuterol (VENTOLIN HFA) 108 (90 Base) MCG/ACT inhaler, Inhale 2 puffs into the lungs every 6 (six) hours as needed for wheezing or shortness of breath. (Patient not taking: Reported on 10/14/2021), Disp: 8 g, Rfl: 0 ? ?Allergies  ?Allergen Reactions  ? Penicillin G Other (See Comments)  ?  Gets sick-vomiting  ? ? ?I personally reviewed active problem list, medication list, allergies, family history, social history, health maintenance with the  patient/caregiver today. ? ? ?ROS ? ?Constitutional: Negative for fever or weight change.  ?Respiratory: Negative for cough and shortness of breath.   ?Cardiovascular: Negative for chest pain or palpitations.  ?Gastrointestinal: Negative for abdominal pain, no bowel changes.  ?Musculoskeletal: Negative for gait problem or joint swelling.  ?Skin: Negative for rash.  ?Neurological: Negative for dizziness or headache.  ?No other specific complaints in a complete review of systems (except as listed in HPI above).  ? ?Objective ? ?Vitals:  ? 10/14/21 0741  ?BP: 120/68  ?Pulse: 68  ?Resp: 16  ?SpO2: 98%  ?Weight: 138 lb (62.6 kg)  ?Height: '5\' 5"'$  (1.651 m)  ? ? ?Body mass index is 22.96 kg/m?. ? ?Physical Exam ? ?Constitutional: Patient appears well-developed and well-nourished.  No distress.  ?HEENT: head atraumatic, normocephalic, pupils equal and reactive to light, neck supple ?Cardiovascular: Normal rate, regular rhythm and normal heart sounds.  No murmur heard. No BLE edema. ?Pulmonary/Chest: Effort normal and breath sounds normal. No respiratory distress. ?Abdominal: Soft.  There is no tenderness. ?Psychiatric: Patient has a normal mood and affect. behavior is normal. Judgment and thought content normal.  ?Muscular Skeletal: crepitus with extension of both knees  ? ?PHQ2/9: ? ?  10/14/2021  ?  7:41 AM 04/16/2021  ?  7:57 AM 11/13/2020  ? 10:53 AM 04/11/2020  ?  8:25 AM 10/03/2019  ?  7:59 AM  ?Depression screen PHQ 2/9  ?Decreased Interest 0 0 0 0 0  ?Down, Depressed, Hopeless 0 0 0 0 0  ?PHQ - 2 Score 0 0 0 0 0  ?Altered sleeping 0    2  ?Tired, decreased energy 0    0  ?Change in appetite 0    0  ?Feeling bad or failure about yourself  0    0  ?Trouble concentrating 0    0  ?Moving slowly or fidgety/restless 0    0  ?Suicidal thoughts 0    0  ?PHQ-9 Score 0    2  ?Difficult doing work/chores     Not difficult at all  ?  ?phq 9 is negative ? ? ?Fall Risk: ? ?  10/14/2021  ?  7:41 AM 04/16/2021  ?  7:57 AM 11/13/2020  ? 10:52 AM  04/11/2020  ?  8:10 AM 10/03/2019  ?  7:59 AM  ?Fall Risk   ?Falls in the past year? 0 1 0 0 0  ?Number falls in past  yr: 0 1 0 0 0  ?Injury with Fall? 0 0 0 0 0  ?Risk for fall due to : No Fall Risks No Fall Risks     ?Follow up Falls prevention discussed Falls prevention discussed   Falls evaluation completed  ? ? ? ? ?Functional Status Survey: ?Is the patient deaf or have difficulty hearing?: No ?Does the patient have difficulty seeing, even when wearing glasses/contacts?: No ?Does the patient have difficulty concentrating, remembering, or making decisions?: No ?Does the patient have difficulty walking or climbing stairs?: No ?Does the patient have difficulty dressing or bathing?: No ?Does the patient have difficulty doing errands alone such as visiting a doctor's office or shopping?: No ? ? ? ?Assessment & Plan ? ?1. Asthma, mild intermittent, well-controlled ? ?Doing well at this time ? ?2. Chronic neck pain ? ?- meloxicam (MOBIC) 15 MG tablet; Take 1 tablet (15 mg total) by mouth daily.  Dispense: 90 tablet; Refill: 1 ? ?3. Chronic pain of right knee ? ?- meloxicam (MOBIC) 15 MG tablet; Take 1 tablet (15 mg total) by mouth daily.  Dispense: 90 tablet; Refill: 1 ? ?4. Vitamin D deficiency ? ?- Cholecalciferol (VITAMIN D3) 50 MCG (2000 UT) capsule; Take 1 capsule (2,000 Units total) by mouth daily.  Dispense: 100 capsule; Refill: 1 ? ?5. Gastroesophageal reflux disease without esophagitis ? ? ?6. Pure hypercholesterolemia ? ? ?7. Hyperglycemia ? ? ?8. Long-term use of high-risk medication ? ? ?9. Muscle spasms of neck ? ?- metaxalone (SKELAXIN) 800 MG tablet; Take 1 tablet (800 mg total) by mouth 3 (three) times daily as needed for muscle spasms.  Dispense: 90 tablet; Refill: 0  ? ?10. Other fatigue ? ?- TSH ?- Vitamin B12 ?- VITAMIN D 25 Hydroxy (Vit-D Deficiency, Fractures) ?- COMPLETE METABOLIC PANEL WITH GFR ?- CBC with Differential/Platelet  ?

## 2021-10-14 ENCOUNTER — Encounter: Payer: Self-pay | Admitting: Family Medicine

## 2021-10-14 ENCOUNTER — Ambulatory Visit (INDEPENDENT_AMBULATORY_CARE_PROVIDER_SITE_OTHER): Payer: BC Managed Care – PPO | Admitting: Family Medicine

## 2021-10-14 VITALS — BP 120/68 | HR 68 | Resp 16 | Ht 65.0 in | Wt 138.0 lb

## 2021-10-14 DIAGNOSIS — R739 Hyperglycemia, unspecified: Secondary | ICD-10-CM

## 2021-10-14 DIAGNOSIS — E7800 Pure hypercholesterolemia, unspecified: Secondary | ICD-10-CM | POA: Diagnosis not present

## 2021-10-14 DIAGNOSIS — M542 Cervicalgia: Secondary | ICD-10-CM | POA: Diagnosis not present

## 2021-10-14 DIAGNOSIS — R5383 Other fatigue: Secondary | ICD-10-CM

## 2021-10-14 DIAGNOSIS — M62838 Other muscle spasm: Secondary | ICD-10-CM

## 2021-10-14 DIAGNOSIS — E559 Vitamin D deficiency, unspecified: Secondary | ICD-10-CM

## 2021-10-14 DIAGNOSIS — G8929 Other chronic pain: Secondary | ICD-10-CM

## 2021-10-14 DIAGNOSIS — J4520 Mild intermittent asthma, uncomplicated: Secondary | ICD-10-CM | POA: Diagnosis not present

## 2021-10-14 DIAGNOSIS — Z79899 Other long term (current) drug therapy: Secondary | ICD-10-CM | POA: Diagnosis not present

## 2021-10-14 DIAGNOSIS — M25561 Pain in right knee: Secondary | ICD-10-CM | POA: Diagnosis not present

## 2021-10-14 DIAGNOSIS — K219 Gastro-esophageal reflux disease without esophagitis: Secondary | ICD-10-CM

## 2021-10-14 MED ORDER — VITAMIN D3 50 MCG (2000 UT) PO CAPS: 2000.0000 [IU] | ORAL_CAPSULE | Freq: Every day | ORAL | 1 refills | Status: DC

## 2021-10-14 MED ORDER — MELOXICAM 15 MG PO TABS: 15.0000 mg | ORAL_TABLET | Freq: Every day | ORAL | 1 refills | Status: DC

## 2021-10-14 MED ORDER — METAXALONE 800 MG PO TABS: 800.0000 mg | ORAL_TABLET | Freq: Three times a day (TID) | ORAL | 0 refills | Status: DC | PRN

## 2021-10-15 LAB — COMPLETE METABOLIC PANEL WITH GFR
AG Ratio: 1.8 (calc) (ref 1.0–2.5)
ALT: 17 U/L (ref 9–46)
AST: 15 U/L (ref 10–35)
Albumin: 4.4 g/dL (ref 3.6–5.1)
Alkaline phosphatase (APISO): 59 U/L (ref 35–144)
BUN: 24 mg/dL (ref 7–25)
CO2: 28 mmol/L (ref 20–32)
Calcium: 9.8 mg/dL (ref 8.6–10.3)
Chloride: 106 mmol/L (ref 98–110)
Creat: 1.07 mg/dL (ref 0.70–1.30)
Globulin: 2.4 g/dL (calc) (ref 1.9–3.7)
Glucose, Bld: 91 mg/dL (ref 65–99)
Potassium: 4.8 mmol/L (ref 3.5–5.3)
Sodium: 141 mmol/L (ref 135–146)
Total Bilirubin: 0.4 mg/dL (ref 0.2–1.2)
Total Protein: 6.8 g/dL (ref 6.1–8.1)
eGFR: 80 mL/min/{1.73_m2} (ref >=60)

## 2021-10-15 LAB — VITAMIN D 25 HYDROXY (VIT D DEFICIENCY, FRACTURES): Vit D, 25-Hydroxy: 25 ng/mL — ABNORMAL LOW (ref 30–100)

## 2021-10-15 LAB — CBC WITH DIFFERENTIAL/PLATELET
Absolute Monocytes: 698 cells/uL (ref 200–950)
Basophils Absolute: 38 cells/uL (ref 0–200)
Basophils Relative: 0.6 %
Eosinophils Absolute: 173 cells/uL (ref 15–500)
Eosinophils Relative: 2.7 %
HCT: 44.4 % (ref 38.5–50.0)
Hemoglobin: 15.3 g/dL (ref 13.2–17.1)
Lymphs Abs: 2349 cells/uL (ref 850–3900)
MCH: 32.3 pg (ref 27.0–33.0)
MCHC: 34.5 g/dL (ref 32.0–36.0)
MCV: 93.7 fL (ref 80.0–100.0)
MPV: 10 fL (ref 7.5–12.5)
Monocytes Relative: 10.9 %
Neutro Abs: 3142 cells/uL (ref 1500–7800)
Neutrophils Relative %: 49.1 %
Platelets: 317 10*3/uL (ref 140–400)
RBC: 4.74 10*6/uL (ref 4.20–5.80)
RDW: 12.2 % (ref 11.0–15.0)
Total Lymphocyte: 36.7 %
WBC: 6.4 10*3/uL (ref 3.8–10.8)

## 2021-10-15 LAB — LIPID PANEL
Cholesterol: 203 mg/dL — ABNORMAL HIGH (ref <200)
HDL: 44 mg/dL (ref >=40)
LDL Cholesterol (Calc): 132 mg/dL (calc) — ABNORMAL HIGH
Non-HDL Cholesterol (Calc): 159 mg/dL (calc) — ABNORMAL HIGH (ref <130)
Total CHOL/HDL Ratio: 4.6 (calc) (ref <5.0)
Triglycerides: 152 mg/dL — ABNORMAL HIGH (ref <150)

## 2021-10-15 LAB — TSH: TSH: 1.67 mIU/L (ref 0.40–4.50)

## 2021-10-15 LAB — HEMOGLOBIN A1C
Hgb A1c MFr Bld: 5.5 % of total Hgb (ref <5.7)
Mean Plasma Glucose: 111 mg/dL
eAG (mmol/L): 6.2 mmol/L

## 2021-10-15 LAB — VITAMIN B12: Vitamin B-12: 341 pg/mL (ref 200–1100)

## 2021-12-31 DIAGNOSIS — M25561 Pain in right knee: Secondary | ICD-10-CM | POA: Diagnosis not present

## 2021-12-31 DIAGNOSIS — R519 Headache, unspecified: Secondary | ICD-10-CM | POA: Diagnosis not present

## 2021-12-31 DIAGNOSIS — E785 Hyperlipidemia, unspecified: Secondary | ICD-10-CM | POA: Diagnosis not present

## 2021-12-31 DIAGNOSIS — R918 Other nonspecific abnormal finding of lung field: Secondary | ICD-10-CM | POA: Diagnosis not present

## 2022-04-15 NOTE — Progress Notes (Unsigned)
Name: James Roman   MRN: [id number]    DOB: 1966/01/29   Date:04/16/2022       Progress Note  Subjective  Chief Complaint  Follow Up  HPI  Hyperglycemia: last hgbA1C was normal was 5.5 % , previously up to 5.8%, he denies polyphagia, polydipsia or polyuria.     Vitamin D deficiency: he is not taking supplementation , discussed taking at least 1000 mcg daily again.    Hyperlipidemia: we gave him Rosuvastatin in May but he never took medication, last LDL in our office April 2023 was 132 , good cholesterol was 44 . He states the a cardiac calcium score at the [place] and thinks it was normal .   The 10-year ASCVD risk score (Arnett DK, et al., 2019) is: 8%   Values used to calculate the score:     Age: 56 years     Sex: Male     Is Non-Hispanic African American: No     Diabetic: No     Tobacco smoker: No     Systolic Blood Pressure: 761 mmHg     Is BP treated: No     HDL Cholesterol: 44 mg/dL     Total Cholesterol: 203 mg/dL      Neck pain:  No longer has tingling or numbness on hands and has been off Nortriptyline and off Meloxicam  neck pain now is zero/10 but gets worse at work due to stress.  I gave him skelaxin on his last visit but he states it did not help   Headaches: seen by Dr. Melrose Nakayama in the past, and was given Nortriptyline. Sister has migraines and responds well to sumatriptan. He has different type of headaches, he states some are dull. He states sometimes it is throbbing, right frontal area, intense and can last all day and sometimes it lingers the following day . He has photophobia, not associated with nausea or vomiting, occasionally has to go home from work. Co-workers can tell when he has the severe headache. Happens about once a week now. He states in the past few months headache has woken him up during the night . That is a change    Asthma: doing well, no cough, wheezing or SOB, he has albuterol but has not used it in a long time.    GERD: doing well at this time,  no heartburn or indigestion. He has intermittent episodes of night. Episodes are episodic, sometimes it wakes him up . He takes Omeprazole prn    OA right knee: seen by [facility] and had Xray that showed mild medial OA with narrowing of joint space, he is on Meloxicam , he uses exercise bands at home, he states feels achy on his joints at the end of the day, no effusion or redness or increase in warmth. Pain level is mild on his knee now    Patient Active Problem List   Diagnosis Date Noted   Chronic left-sided low back pain without sciatica 09/19/2018   Right knee pain 09/19/2018   Vitamin D deficiency 09/04/2016   Low serum phosphorus for age 25/23/2018   Hyperglycemia 04/21/2015   Hyperlipidemia 04/18/2015   Asthma, mild intermittent, well-controlled 04/18/2015   GERD (gastroesophageal reflux disease) 04/18/2015   Cephalalgia 10/04/2014   Cervical pain 10/04/2014    No past surgical history on file.  Family History  Problem Relation Age of Onset   Cancer Mother    Hearing loss Father    Hyperlipidemia Father  Skin cancer Father        Back   Cancer Sister        Skin   Anemia Daughter     Social History   Tobacco Use   Smoking status: Never   Smokeless tobacco: Never  Substance Use Topics   Alcohol use: No    Alcohol/week: 0.0 standard drinks of alcohol     Current Outpatient Medications:    albuterol (VENTOLIN HFA) 108 (90 Base) MCG/ACT inhaler, Inhale 2 puffs into the lungs every 6 (six) hours as needed for wheezing or shortness of breath. (Patient not taking: Reported on 04/16/2022), Disp: 8 g, Rfl: 0   Cholecalciferol (VITAMIN D3) 50 MCG (2000 UT) capsule, Take 1 capsule (2,000 Units total) by mouth daily. (Patient not taking: Reported on 04/16/2022), Disp: 100 capsule, Rfl: 1   meloxicam (MOBIC) 15 MG tablet, Take 1 tablet (15 mg total) by mouth daily. (Patient not taking: Reported on 04/16/2022), Disp: 90 tablet, Rfl: 1   metaxalone (SKELAXIN) 800 MG tablet,  Take 1 tablet (800 mg total) by mouth 3 (three) times daily as needed for muscle spasms. (Patient not taking: Reported on 04/16/2022), Disp: 90 tablet, Rfl: 0  Allergies  Allergen Reactions   Penicillin G Other (See Comments)    Gets sick-vomiting    I personally reviewed active problem list, medication list, allergies, family history, social history, health maintenance with the patient/caregiver today.   ROS  Constitutional: Negative for fever or weight change.  Respiratory: Negative for cough and shortness of breath.   Cardiovascular: Negative for chest pain or palpitations.  Gastrointestinal: Negative for abdominal pain, no bowel changes.  Musculoskeletal: Negative for gait problem or joint swelling.  Skin: Negative for rash.  Neurological: Negative for dizziness , positive  headache.  No other specific complaints in a complete review of systems (except as listed in HPI above).   Objective  Vitals:   04/16/22 0745  BP: 126/84  Pulse: 75  Resp: 16  SpO2: 98%  Weight: 138 lb (62.6 kg)  Height: '5\' 5"'$  (1.651 m)    Body mass index is 22.96 kg/m.  Physical Exam  Constitutional: Patient appears well-developed and well-nourished.  No distress.  HEENT: head atraumatic, normocephalic, pupils equal and reactive to light, neck supple Cardiovascular: Normal rate, regular rhythm and normal heart sounds.  No murmur heard. No BLE edema. Pulmonary/Chest: Effort normal and breath sounds normal. No respiratory distress. Abdominal: Soft.  There is no tenderness. Neurological: no focal findings  Psychiatric: Patient has a normal mood and affect. behavior is normal. Judgment and thought content normal.   PHQ2/9:    04/16/2022    7:44 AM 10/14/2021    7:41 AM 04/16/2021    7:57 AM 11/13/2020   10:53 AM 04/11/2020    8:25 AM  Depression screen PHQ 2/9  Decreased Interest 0 0 0 0 0  Down, Depressed, Hopeless 1 0 0 0 0  PHQ - 2 Score 1 0 0 0 0  Altered sleeping 0 0     Tired, decreased  energy 0 0     Change in appetite 0 0     Feeling bad or failure about yourself  0 0     Trouble concentrating 0 0     Moving slowly or fidgety/restless 0 0     Suicidal thoughts 0 0     PHQ-9 Score 1 0       phq 9 is positive   Fall Risk:    04/16/2022  7:44 AM 10/14/2021    7:41 AM 04/16/2021    7:57 AM 11/13/2020   10:52 AM 04/11/2020    8:10 AM  Fall Risk   Falls in the past year? 0 0 1 0 0  Number falls in past yr: 0 0 1 0 0  Injury with Fall? 0 0 0 0 0  Risk for fall due to : No Fall Risks No Fall Risks No Fall Risks    Follow up Falls prevention discussed Falls prevention discussed Falls prevention discussed        Functional Status Survey: Is the patient deaf or have difficulty hearing?: Yes Does the patient have difficulty seeing, even when wearing glasses/contacts?: Yes Does the patient have difficulty concentrating, remembering, or making decisions?: No Does the patient have difficulty walking or climbing stairs?: No Does the patient have difficulty dressing or bathing?: No Does the patient have difficulty doing errands alone such as visiting a doctor's office or shopping?: No    Assessment & Plan  1. Migraine without aura and without status migrainosus, not intractable  - Ubrogepant (UBRELVY) 100 MG TABS; Take 1 tablet by mouth daily as needed.  Dispense: 30 tablet; Refill: 0  2. Chronic neck pain  - DULoxetine (CYMBALTA) 30 MG capsule; Take 1 capsule (30 mg total) by mouth daily.  Dispense: 90 capsule; Refill: 1  3. Sensory hearing loss, bilateral  Not wearing hearing aids  4. Gastroesophageal reflux disease without esophagitis  Taking omeprazole prn   5. Vitamin D deficiency  Recommend to resume vitamin D   6. Asthma, mild intermittent, well-controlled  Stable   7. Pure hypercholesterolemia  On diet only  8. Hyperglycemia   9. Headache disorder  - MR BRAIN W WO CONTRAST; Future  10. Chronic pain of right knee  - DULoxetine  (CYMBALTA) 30 MG capsule; Take 1 capsule (30 mg total) by mouth daily.  Dispense: 90 capsule; Refill: 1

## 2022-04-16 ENCOUNTER — Ambulatory Visit (INDEPENDENT_AMBULATORY_CARE_PROVIDER_SITE_OTHER): Payer: BC Managed Care – PPO | Admitting: Family Medicine

## 2022-04-16 ENCOUNTER — Encounter: Payer: Self-pay | Admitting: Family Medicine

## 2022-04-16 VITALS — BP 126/84 | HR 75 | Resp 16 | Ht 65.0 in | Wt 138.0 lb

## 2022-04-16 DIAGNOSIS — M542 Cervicalgia: Secondary | ICD-10-CM | POA: Diagnosis not present

## 2022-04-16 DIAGNOSIS — E7800 Pure hypercholesterolemia, unspecified: Secondary | ICD-10-CM

## 2022-04-16 DIAGNOSIS — E559 Vitamin D deficiency, unspecified: Secondary | ICD-10-CM

## 2022-04-16 DIAGNOSIS — R519 Headache, unspecified: Secondary | ICD-10-CM

## 2022-04-16 DIAGNOSIS — H903 Sensorineural hearing loss, bilateral: Secondary | ICD-10-CM

## 2022-04-16 DIAGNOSIS — G8929 Other chronic pain: Secondary | ICD-10-CM

## 2022-04-16 DIAGNOSIS — G43009 Migraine without aura, not intractable, without status migrainosus: Secondary | ICD-10-CM

## 2022-04-16 DIAGNOSIS — J4520 Mild intermittent asthma, uncomplicated: Secondary | ICD-10-CM

## 2022-04-16 DIAGNOSIS — M25561 Pain in right knee: Secondary | ICD-10-CM

## 2022-04-16 DIAGNOSIS — R739 Hyperglycemia, unspecified: Secondary | ICD-10-CM

## 2022-04-16 DIAGNOSIS — K219 Gastro-esophageal reflux disease without esophagitis: Secondary | ICD-10-CM | POA: Diagnosis not present

## 2022-04-16 MED ORDER — OMEPRAZOLE 20 MG PO CPDR: 20.0000 mg | DELAYED_RELEASE_CAPSULE | Freq: Every day | ORAL | 3 refills | Status: DC

## 2022-04-16 MED ORDER — DULOXETINE HCL 30 MG PO CPEP: 30.0000 mg | ORAL_CAPSULE | Freq: Every day | ORAL | 1 refills | Status: DC

## 2022-04-16 MED ORDER — UBRELVY 100 MG PO TABS: 1.0000 | ORAL_TABLET | Freq: Every day | ORAL | 0 refills | Status: DC | PRN

## 2022-04-21 ENCOUNTER — Other Ambulatory Visit: Payer: Self-pay | Admitting: Family Medicine

## 2022-04-21 ENCOUNTER — Telehealth: Payer: Self-pay | Admitting: Family Medicine

## 2022-04-21 DIAGNOSIS — G43009 Migraine without aura, not intractable, without status migrainosus: Secondary | ICD-10-CM

## 2022-04-21 MED ORDER — SUMATRIPTAN SUCCINATE 50 MG PO TABS: 50.0000 mg | ORAL_TABLET | ORAL | 0 refills | Status: DC | PRN

## 2022-04-21 NOTE — Telephone Encounter (Signed)
Copied from [place] [id number]. Topic: General - Other >> Apr 21, 2022 11:16 AM Leitha Schuller wrote: Caller requesting additional clinical note information regarding PA for Ubrogepant (UBRELVY) 100 MG TABS  Caller requesting a cb asap

## 2022-04-22 ENCOUNTER — Ambulatory Visit
Admission: RE | Admit: 2022-04-22 | Discharge: 2022-04-22 | Disposition: A | Discharge status: Home / Self Care | Payer: BC Managed Care – PPO | Source: Ambulatory Visit | Attending: Family Medicine | Admitting: Family Medicine

## 2022-04-22 DIAGNOSIS — R519 Headache, unspecified: Secondary | ICD-10-CM | POA: Insufficient documentation

## 2022-04-22 MED ORDER — GADOBUTROL 1 MMOL/ML IV SOLN
7.5000 mL | Freq: Once | INTRAVENOUS | Status: AC | PRN
  Administered 2022-04-22: 7.5 mL via INTRAVENOUS

## 2022-04-29 NOTE — Progress Notes (Signed)
Name: James Roman   MRN: [id number]    DOB: June 19, 1966   Date:05/01/2022       Progress Note  Subjective  Chief Complaint  Annual Exam  HPI  Patient presents for annual CPE.  IPSS Questionnaire (AUA-7): Over the past month.   1)  How often have you had a sensation of not emptying your bladder completely after you finish urinating?  0 - Not at all  2)  How often have you had to urinate again less than two hours after you finished urinating? 0 - Not at all  3)  How often have you found you stopped and started again several times when you urinated?  0 - Not at all  4) How difficult have you found it to postpone urination?  0 - Not at all  5) How often have you had a weak urinary stream?  0 - Not at all  6) How often have you had to push or strain to begin urination?  0 - Not at all  7) How many times did you most typically get up to urinate from the time you went to bed until the time you got up in the morning?  1 - 1 time  Total score:  0-7 mildly symptomatic   8-19 moderately symptomatic   20-35 severely symptomatic     Diet: he is not sure about his diet, he likes vegetables and fruit, not fish, he is out frequently Exercise: discussed 150 minutes per week  Last Dental Exam: twice year  Last Eye Exam:he has a follow up this week  Depression: phq 9 is negative    05/01/2022    7:44 AM 04/16/2022    7:44 AM 10/14/2021    7:41 AM 04/16/2021    7:57 AM 11/13/2020   10:53 AM  Depression screen PHQ 2/9  Decreased Interest 0 0 0 0 0  Down, Depressed, Hopeless 0 1 0 0 0  PHQ - 2 Score 0 1 0 0 0  Altered sleeping 0 0 0    Tired, decreased energy 1 0 0    Change in appetite 0 0 0    Feeling bad or failure about yourself  0 0 0    Trouble concentrating 0 0 0    Moving slowly or fidgety/restless 0 0 0    Suicidal thoughts 0 0 0    PHQ-9 Score 1 1 0      Hypertension:  BP Readings from Last 3 Encounters:  05/01/22 124/72  04/16/22 126/84  10/14/21 120/68    Obesity: Wt  Readings from Last 3 Encounters:  05/01/22 139 lb (63 kg)  04/16/22 138 lb (62.6 kg)  10/14/21 138 lb (62.6 kg)   BMI Readings from Last 3 Encounters:  05/01/22 23.13 kg/m  04/16/22 22.96 kg/m  10/14/21 22.96 kg/m     Lipids:  Lab Results  Component Value Date   CHOL 203 (H) 10/14/2021   CHOL 207 (A) 04/02/2021   CHOL 240 (H) 04/11/2020   Lab Results  Component Value Date   HDL 44 10/14/2021   HDL 45 04/02/2021   HDL 43 04/11/2020   Lab Results  Component Value Date   LDLCALC 132 (H) 10/14/2021   LDLCALC 136 04/02/2021   LDLCALC 177 (H) 04/11/2020   Lab Results  Component Value Date   TRIG 152 (H) 10/14/2021   TRIG 244 (A) 04/02/2021   TRIG 111 04/11/2020   Lab Results  Component Value Date   CHOLHDL 4.6  10/14/2021   CHOLHDL 6.1 (H) 10/03/2019   CHOLHDL 5.3 (H) 03/03/2018   No results found for: "LDLDIRECT" Glucose:  Glucose  Date Value Ref Range Status  04/11/2020 93 65 - 99 mg/dL Final  04/13/2012 135 (H) 65 - 99 mg/dL Final   Glucose, Bld  Date Value Ref Range Status  10/14/2021 91 65 - 99 mg/dL Final    Comment:    .            Fasting reference interval .   10/03/2019 90 65 - 99 mg/dL Final    Comment:    .            Fasting reference interval .   03/03/2018 91 65 - 139 mg/dL Final    Comment:    .        Non-fasting reference interval .     [place] Office Visit from 04/16/2021 in [facility]  AUDIT-C Score 0      Married STD testing and prevention (HIV/chl/gon/syphilis): 04/11/20 Sexual history:  Hep C Screening: 03/23/12 Skin cancer: Discussed monitoring for atypical lesions Colorectal cancer: 05/25/14 Prostate cancer:   Lab Results  Component Value Date   PSA 0.69 08/19/2018   PSA 1.0 08/27/2016   PSA 1.1 normal 04/19/2014     Lung cancer:  Low Dose CT Chest recommended if Age 39-80 years, 30 pack-year currently smoking OR have quit w/in 15years. Patient  is not a candidate for screening    AAA: The USPSTF recommends one-time screening with ultrasonography in men ages 97 to 74 years who have ever smoked. Patient   is not  a candidate for screening  ECG:  04/13/12  Vaccines:   Tdap: up to date Shingrix: up to date Pneumonia: N/A Flu: Never COVID-19: discussed Booster   Advanced Care Planning: A voluntary discussion about advance care planning including the explanation and discussion of advance directives.  Discussed health care proxy and Living will, and the patient was able to identify a health care proxy as wife .  Patient does not have a living will and power of attorney of health care   Patient Active Problem List   Diagnosis Date Noted   Chronic left-sided low back pain without sciatica 09/19/2018   Right knee pain 09/19/2018   Vitamin D deficiency 09/04/2016   Low serum phosphorus for age 61/23/2018   Hyperglycemia 04/21/2015   Hyperlipidemia 04/18/2015   Asthma, mild intermittent, well-controlled 04/18/2015   GERD (gastroesophageal reflux disease) 04/18/2015   Cephalalgia 10/04/2014   Cervical pain 10/04/2014    No past surgical history on file.  Family History  Problem Relation Age of Onset   Cancer Mother    Hearing loss Father    Hyperlipidemia Father    Skin cancer Father        Back   Cancer Sister        Skin   Anemia Daughter     Social History   Socioeconomic History   Marital status: Married    Spouse name: Manuela Schwartz   Number of children: 2   Years of education: Not on file   Highest education level: High school graduate  Occupational History   Not on file  Tobacco Use   Smoking status: Never   Smokeless tobacco: Never  Vaping Use   Vaping Use: Never used  Substance and Sexual Activity   Alcohol use: No    Alcohol/week: 0.0 standard drinks of alcohol   Drug use: No  Sexual activity: Yes    Partners: Female  Other Topics Concern   Not on file  Social History Narrative   Married , no children in the home    He has a daughter  that was given for adoption as an  infant, they are connecting now    He also has another grown daughter that has two teenager children   Working at [employer] ( part of [employer])   Lead person - assembly line       Patient has 2 daughters & 3 step-daughters   3 grandkids      Social Determinants of Health   Financial Resource Strain: Low Risk  (05/01/2022)   Overall Financial Resource Strain (CARDIA)    Difficulty of Paying Living Expenses: Not hard at all  Food Insecurity: No Food Insecurity (05/01/2022)   Hunger Vital Sign    Worried About Running Out of Food in the Last Year: Never true    [place] in the Last Year: Never true  Transportation Needs: No Transportation Needs (05/01/2022)   PRAPARE - Hydrologist (Medical): No    Lack of Transportation (Non-Medical): No  Physical Activity: Inactive (05/01/2022)   Exercise Vital Sign    Days of Exercise per Week: 0 days    Minutes of Exercise per Session: 0 min  Stress: Stress Concern Present (05/01/2022)   [place]    Feeling of Stress : To some extent  Social Connections: Moderately Integrated (05/01/2022)   Social Connection and Isolation Panel [NHANES]    Frequency of Communication with Friends and Family: More than three times a week    Frequency of Social Gatherings with Friends and Family: Once a week    Attends Religious Services: More than 4 times per year    Active Member of [employer] or Organizations: No    Attends Archivist Meetings: Never    Marital Status: Married  Human resources officer Violence: Not At Risk (05/01/2022)   Humiliation, Afraid, Rape, and Kick questionnaire    Fear of Current or Ex-Partner: No    Emotionally Abused: No    Physically Abused: No    Sexually Abused: No     Current Outpatient Medications:    Cholecalciferol (VITAMIN D3) 50 MCG (2000 UT) capsule, Take 1 capsule (2,000 Units  total) by mouth daily., Disp: 100 capsule, Rfl: 1   DULoxetine (CYMBALTA) 30 MG capsule, Take 1 capsule (30 mg total) by mouth daily., Disp: 90 capsule, Rfl: 1   omeprazole (PRILOSEC) 20 MG capsule, Take 1 capsule (20 mg total) by mouth daily., Disp: 90 capsule, Rfl: 3   SUMAtriptan (IMITREX) 50 MG tablet, Take 1 tablet (50 mg total) by mouth every 2 (two) hours as needed for migraine. May repeat in 2 hours if headache persists or recurs., Disp: 10 tablet, Rfl: 0   albuterol (VENTOLIN HFA) 108 (90 Base) MCG/ACT inhaler, Inhale 2 puffs into the lungs every 6 (six) hours as needed for wheezing or shortness of breath. (Patient not taking: Reported on 04/16/2022), Disp: 8 g, Rfl: 0   Ubrogepant (UBRELVY) 100 MG TABS, Take 1 tablet by mouth daily as needed. (Patient not taking: Reported on 05/01/2022), Disp: 30 tablet, Rfl: 0  Allergies  Allergen Reactions   Penicillin G Other (See Comments)    Gets sick-vomiting     ROS  Constitutional: Negative for fever or weight change.  Respiratory: Negative for cough and shortness  of breath.   Cardiovascular: Negative for chest pain or palpitations.  Gastrointestinal: Negative for abdominal pain, no bowel changes.  Musculoskeletal: Negative for gait problem or joint swelling.  Skin: Negative for rash.  Neurological: Negative for dizziness, positive for intermittent  headache.  No other specific complaints in a complete review of systems (except as listed in HPI above).    Objective  Vitals:   05/01/22 0747  BP: 124/72  Pulse: 73  Resp: 16  SpO2: 99%  Weight: 139 lb (63 kg)  Height: '5\' 5"'$  (1.651 m)    Body mass index is 23.13 kg/m.  Physical Exam  Constitutional: Patient appears well-developed and well-nourished. No distress.  HENT: Head: Normocephalic and atraumatic. Ears: B TMs ok, no erythema or effusion; Nose: Nose normal. Mouth/Throat: Oropharynx is clear and moist. No oropharyngeal exudate.  Eyes: Conjunctivae and EOM are normal.  Pupils are equal, round, and reactive to light. No scleral icterus.  Neck: Normal range of motion. Neck supple. No JVD present. No thyromegaly present.  Cardiovascular: Normal rate, regular rhythm and normal heart sounds.  No murmur heard. No BLE edema. Pulmonary/Chest: Effort normal and breath sounds normal. No respiratory distress. Abdominal: Soft. Bowel sounds are normal, no distension. There is no tenderness. no masses MALE GENITALIA: Normal descended testes bilaterally, no masses palpated, no hernias, no lesions, no discharge RECTAL: not done  Musculoskeletal: Normal range of motion, no joint effusions. No gross deformities Neurological: he is alert and oriented to person, place, and time. No cranial nerve deficit. Coordination, balance, strength, speech and gait are normal.  Skin: Skin is warm and dry. No rash noted. No erythema.  Psychiatric: Patient has a normal mood and affect. behavior is normal. Judgment and thought content normal.    Fall Risk:    05/01/2022    7:43 AM 04/16/2022    7:44 AM 10/14/2021    7:41 AM 04/16/2021    7:57 AM 11/13/2020   10:52 AM  Fall Risk   Falls in the past year? 0 0 0 1 0  Number falls in past yr: 0 0 0 1 0  Injury with Fall? 0 0 0 0 0  Risk for fall due to : No Fall Risks No Fall Risks No Fall Risks No Fall Risks   Follow up Falls prevention discussed Falls prevention discussed Falls prevention discussed Falls prevention discussed      Functional Status Survey: Is the patient deaf or have difficulty hearing?: Yes Does the patient have difficulty seeing, even when wearing glasses/contacts?: Yes Does the patient have difficulty concentrating, remembering, or making decisions?: No Does the patient have difficulty walking or climbing stairs?: Yes Does the patient have difficulty dressing or bathing?: No Does the patient have difficulty doing errands alone such as visiting a doctor's office or shopping?: No    Assessment & Plan  1. Well adult  exam  Discussed importance of regular physical activity    -Prostate cancer screening and PSA options (with potential risks and benefits of testing vs not testing) were discussed along with recent recs/guidelines. -USPSTF grade A and B recommendations reviewed with patient; age-appropriate recommendations, preventive care, screening tests, etc discussed and encouraged; healthy living encouraged; see AVS for patient education given to patient -Discussed importance of 150 minutes of physical activity weekly, eat two servings of fish weekly, eat one serving of tree nuts ( cashews, pistachios, pecans, almonds.Marland Kitchen) every other day, eat 6 servings of fruit/vegetables daily and drink plenty of water and avoid sweet beverages.  -Reviewed  Health Maintenance: yes

## 2022-04-29 NOTE — Patient Instructions (Signed)

## 2022-05-01 ENCOUNTER — Encounter: Payer: Self-pay | Admitting: Family Medicine

## 2022-05-01 ENCOUNTER — Ambulatory Visit (INDEPENDENT_AMBULATORY_CARE_PROVIDER_SITE_OTHER): Payer: BC Managed Care – PPO | Admitting: Family Medicine

## 2022-05-01 VITALS — BP 124/72 | HR 73 | Resp 16 | Ht 65.0 in | Wt 139.0 lb

## 2022-05-01 DIAGNOSIS — Z0000 Encounter for general adult medical examination without abnormal findings: Secondary | ICD-10-CM | POA: Diagnosis not present

## 2022-05-06 DIAGNOSIS — H35363 Drusen (degenerative) of macula, bilateral: Secondary | ICD-10-CM | POA: Diagnosis not present

## 2022-05-24 ENCOUNTER — Other Ambulatory Visit: Payer: Self-pay | Admitting: Family Medicine

## 2022-05-24 DIAGNOSIS — G43009 Migraine without aura, not intractable, without status migrainosus: Secondary | ICD-10-CM

## 2022-06-22 ENCOUNTER — Other Ambulatory Visit: Payer: Self-pay | Admitting: Family Medicine

## 2022-06-22 DIAGNOSIS — G43009 Migraine without aura, not intractable, without status migrainosus: Secondary | ICD-10-CM

## 2022-07-16 NOTE — Progress Notes (Signed)
Name: James Roman   MRN: [id number]    DOB: [birth year]   Date:07/17/2022       Progress Note  Subjective  Chief Complaint  Follow Up  HPI  Hyperglycemia: last hgbA1C was normal was 5.5 % , previously up to 5.8%, he denies polyphagia, polydipsia or polyuria.  Discussed healthier diet    Vitamin D deficiency: reminded him to take vitamin D otc again    Hyperlipidemia: we gave him Rosuvastatin in  the past but he never took it. He states the a cardiac calcium score at the [place] and thinks it was normal . MRI brain showed tortuous vertebral artery  explain to him he must take statins and start aspirin 81 mg until seen by neurologist    Neck pain:  No longer has tingling or numbness on hands and has been off Nortriptyline   I gave him skelaxin on his last visit but he states it did not help . 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 Melrose Nakayama in the past, and was given Nortriptyline.  He has different type of headaches, he states some are dull. He states sometimes it is throbbing, right frontal area, intense and can last all day and sometimes it lingers the following day . He has photophobia, not associated with nausea or vomiting, occasionally has to go home from work. Co-workers can tell when he has the severe headache. Happens about once a week now. When he came in October his headaches were getting worse and waking him up at night. We checked MRI of brain and showed tortuous vertebral artery and some mass effect on pons. We faxed results to Dr. Melrose Nakayama but he never got an appointment. We gave him Imitrex and he states it works well for him, taking it about once a week. Explained that Dr. Melrose Nakayama may stop medication due to finding on MRI brain. Also advised to start statin therapy and aspirin Advised patient to contact us or Dr. Melrose Nakayama if he does not hear about an appointment within the next week    Asthma: doing well, no cough, wheezing  or SOB, he has albuterol but has not used it in a long time. Unchanged    GERD: doing well at this time, no heartburn or indigestion. He has intermittent episodes of night. Episodes are episodic, he only takes PPI prn and not in months    OA right knee: seen by [facility] and had Xray that showed mild medial OA with narrowing of joint space, he is on Meloxicam but only takign it prn  , he uses exercise bands at home, he states feels achy on his joints at the end of the day, no effusion or redness or increase in warmth. Pain level is mild on his knee now    Patient Active Problem List   Diagnosis Date Noted   Chronic left-sided low back pain without sciatica 09/19/2018   Right knee pain 09/19/2018   Vitamin D deficiency 09/04/2016   Low serum phosphorus for age 73/23/2018   Hyperglycemia 04/21/2015   Hyperlipidemia 04/18/2015   Asthma, mild intermittent, well-controlled 04/18/2015   GERD (gastroesophageal reflux disease) 04/18/2015   Cephalalgia 10/04/2014   Cervical pain 10/04/2014    No past surgical history on file.  Family History  Problem Relation Age of Onset   Cancer Mother    Hearing loss Father    Hyperlipidemia Father    Skin cancer Father  Back   Cancer Sister        Skin   Anemia Daughter     Social History   Tobacco Use   Smoking status: Never   Smokeless tobacco: Never  Substance Use Topics   Alcohol use: No    Alcohol/week: 0.0 standard drinks of alcohol     Current Outpatient Medications:    Cholecalciferol (VITAMIN D3) 50 MCG (2000 UT) capsule, Take 1 capsule (2,000 Units total) by mouth daily., Disp: 100 capsule, Rfl: 1   DULoxetine (CYMBALTA) 30 MG capsule, Take 1 capsule (30 mg total) by mouth daily., Disp: 90 capsule, Rfl: 1   meloxicam (MOBIC) 15 MG tablet, Take 15 mg by mouth daily., Disp: , Rfl:    omeprazole (PRILOSEC) 20 MG capsule, Take 1 capsule (20 mg total) by mouth daily., Disp: 90 capsule, Rfl: 3   SUMAtriptan (IMITREX) 50 MG  tablet, TAKE 1 TABLET BY MOUTH EVERY 2 (TWO) HOURS AS NEEDED FOR MIGRAINE. MAY REPEAT IN 2 HOURS IF HEADACHE PERSISTS OR RECURS., Disp: 10 tablet, Rfl: 0   albuterol (VENTOLIN HFA) 108 (90 Base) MCG/ACT inhaler, Inhale 2 puffs into the lungs every 6 (six) hours as needed for wheezing or shortness of breath. (Patient not taking: Reported on 04/16/2022), Disp: 8 g, Rfl: 0  Allergies  Allergen Reactions   Penicillin G Other (See Comments)    Gets sick-vomiting    I personally reviewed active problem list, medication list, allergies, family history, social history, health maintenance with the patient/caregiver today.   ROS  Constitutional: Negative for fever or weight change.  Respiratory: Negative for cough and shortness of breath.   Cardiovascular: Negative for chest pain or palpitations.  Gastrointestinal: Negative for abdominal pain, no bowel changes.  Musculoskeletal: Negative for gait problem or joint swelling.  Skin: Negative for rash.  Neurological: Negative for dizziness or headache.  No other specific complaints in a complete review of systems (except as listed in HPI above).   Objective  Vitals:   07/17/22 0748  BP: 126/70  Pulse: 79  Resp: 16  SpO2: 97%  Weight: 145 lb (65.8 kg)  Height: '5\' 5"'$  (1.651 m)    Body mass index is 24.13 kg/m.  Physical Exam  Constitutional: Patient appears well-developed and well-nourished. Obese  No distress.  HEENT: head atraumatic, normocephalic, pupils equal and reactive to light, neck supple, throat within normal limits Cardiovascular: Normal rate, regular rhythm and normal heart sounds.  No murmur heard. No BLE edema. Pulmonary/Chest: Effort normal and breath sounds normal. No respiratory distress. Abdominal: Soft.  There is no tenderness. Psychiatric: Patient has a normal mood and affect. behavior is normal. Judgment and thought content normal.    PHQ2/9:    07/17/2022    7:47 AM 05/01/2022    7:44 AM 04/16/2022    7:44 AM  10/14/2021    7:41 AM 04/16/2021    7:57 AM  Depression screen PHQ 2/9  Decreased Interest 0 0 0 0 0  Down, Depressed, Hopeless 0 0 1 0 0  PHQ - 2 Score 0 0 1 0 0  Altered sleeping 0 0 0 0   Tired, decreased energy 2 1 0 0   Change in appetite 0 0 0 0   Feeling bad or failure about yourself  0 0 0 0   Trouble concentrating 0 0 0 0   Moving slowly or fidgety/restless 0 0 0 0   Suicidal thoughts 0 0 0 0   PHQ-9 Score '2 1 1 '$ 0  phq 9 is negative   Fall Risk:    07/17/2022    7:47 AM 05/01/2022    7:43 AM 04/16/2022    7:44 AM 10/14/2021    7:41 AM 04/16/2021    7:57 AM  Fall Risk   Falls in the past year? 0 0 0 0 1  Number falls in past yr: 0 0 0 0 1  Injury with Fall? 0 0 0 0 0  Risk for fall due to : No Fall Risks No Fall Risks No Fall Risks No Fall Risks No Fall Risks  Follow up Falls prevention discussed Falls prevention discussed Falls prevention discussed Falls prevention discussed Falls prevention discussed      Functional Status Survey: Is the patient deaf or have difficulty hearing?: Yes Does the patient have difficulty seeing, even when wearing glasses/contacts?: No Does the patient have difficulty concentrating, remembering, or making decisions?: No Does the patient have difficulty walking or climbing stairs?: No Does the patient have difficulty dressing or bathing?: No Does the patient have difficulty doing errands alone such as visiting a doctor's office or shopping?: No    Assessment & Plan  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  Dispense: 90 tablet; Refill: 1 - aspirin EC 81 MG tablet; Take 1 tablet (81 mg total) by mouth daily. Swallow whole.  Dispense: 30 tablet; Refill: 12  2. Headache, worsening  - Ambulatory referral to Neurology  3. Chronic neck pain  Stable   4. Headache disorder  - Ambulatory referral to Neurology  5. Migraine without  aura and without status migrainosus, not intractable  - Ambulatory referral to Neurology - SUMAtriptan (IMITREX) 100 MG tablet; TAKE 1 TABLET BY MOUTH EVERY 2 (TWO) HOURS AS NEEDED FOR MIGRAINE. MAY REPEAT IN 2 HOURS IF HEADACHE PERSISTS OR RECURS.  Dispense: 10 tablet; Refill: 0  6. Vitamin D deficiency  Advised to resume medication  7. Pure hypercholesterolemia   Start Crestor

## 2022-07-17 ENCOUNTER — Ambulatory Visit (INDEPENDENT_AMBULATORY_CARE_PROVIDER_SITE_OTHER): Payer: BC Managed Care – PPO | Admitting: Family Medicine

## 2022-07-17 ENCOUNTER — Encounter: Payer: Self-pay | Admitting: Family Medicine

## 2022-07-17 VITALS — BP 126/70 | HR 79 | Resp 16 | Ht 65.0 in | Wt 145.0 lb

## 2022-07-17 DIAGNOSIS — R519 Headache, unspecified: Secondary | ICD-10-CM

## 2022-07-17 DIAGNOSIS — G43009 Migraine without aura, not intractable, without status migrainosus: Secondary | ICD-10-CM | POA: Diagnosis not present

## 2022-07-17 DIAGNOSIS — I771 Stricture of artery: Secondary | ICD-10-CM

## 2022-07-17 DIAGNOSIS — M542 Cervicalgia: Secondary | ICD-10-CM

## 2022-07-17 DIAGNOSIS — E559 Vitamin D deficiency, unspecified: Secondary | ICD-10-CM

## 2022-07-17 DIAGNOSIS — G8929 Other chronic pain: Secondary | ICD-10-CM

## 2022-07-17 DIAGNOSIS — E7800 Pure hypercholesterolemia, unspecified: Secondary | ICD-10-CM

## 2022-07-17 MED ORDER — ROSUVASTATIN CALCIUM 5 MG PO TABS: 5.0000 mg | ORAL_TABLET | Freq: Every day | ORAL | 1 refills | Status: DC

## 2022-07-17 MED ORDER — ASPIRIN 81 MG PO TBEC: 81.0000 mg | DELAYED_RELEASE_TABLET | Freq: Every day | ORAL | 12 refills | Status: DC

## 2022-07-17 MED ORDER — B-12 1000 MCG SL SUBL: 1.0000 | SUBLINGUAL_TABLET | Freq: Every day | SUBLINGUAL | 1 refills | Status: DC

## 2022-07-17 MED ORDER — SUMATRIPTAN SUCCINATE 100 MG PO TABS: ORAL_TABLET | ORAL | 0 refills | Status: DC

## 2022-07-22 ENCOUNTER — Other Ambulatory Visit: Payer: Self-pay | Admitting: Family Medicine

## 2022-07-22 DIAGNOSIS — M25561 Pain in right knee: Secondary | ICD-10-CM

## 2022-07-22 DIAGNOSIS — M542 Cervicalgia: Secondary | ICD-10-CM

## 2022-07-31 DIAGNOSIS — R519 Headache, unspecified: Secondary | ICD-10-CM | POA: Diagnosis not present

## 2022-07-31 DIAGNOSIS — H9311 Tinnitus, right ear: Secondary | ICD-10-CM | POA: Diagnosis not present

## 2022-07-31 DIAGNOSIS — R930 Abnormal findings on diagnostic imaging of skull and head, not elsewhere classified: Secondary | ICD-10-CM | POA: Diagnosis not present

## 2022-08-15 ENCOUNTER — Other Ambulatory Visit: Payer: Self-pay | Admitting: Family Medicine

## 2022-08-15 DIAGNOSIS — G43009 Migraine without aura, not intractable, without status migrainosus: Secondary | ICD-10-CM

## 2022-08-28 DIAGNOSIS — H9311 Tinnitus, right ear: Secondary | ICD-10-CM | POA: Diagnosis not present

## 2022-08-28 DIAGNOSIS — R519 Headache, unspecified: Secondary | ICD-10-CM | POA: Diagnosis not present

## 2022-08-28 DIAGNOSIS — R930 Abnormal findings on diagnostic imaging of skull and head, not elsewhere classified: Secondary | ICD-10-CM | POA: Diagnosis not present

## 2022-09-03 ENCOUNTER — Other Ambulatory Visit: Payer: Self-pay | Admitting: Student

## 2022-09-03 DIAGNOSIS — R9089 Other abnormal findings on diagnostic imaging of central nervous system: Secondary | ICD-10-CM

## 2022-09-03 DIAGNOSIS — H9311 Tinnitus, right ear: Secondary | ICD-10-CM

## 2022-09-03 DIAGNOSIS — I671 Cerebral aneurysm, nonruptured: Secondary | ICD-10-CM

## 2022-09-10 ENCOUNTER — Ambulatory Visit
Admission: RE | Admit: 2022-09-10 | Discharge: 2022-09-10 | Disposition: A | Discharge status: Home / Self Care | Payer: BC Managed Care – PPO | Source: Ambulatory Visit | Attending: Student | Admitting: Student

## 2022-09-10 DIAGNOSIS — I671 Cerebral aneurysm, nonruptured: Secondary | ICD-10-CM | POA: Diagnosis not present

## 2022-09-10 DIAGNOSIS — R9089 Other abnormal findings on diagnostic imaging of central nervous system: Secondary | ICD-10-CM

## 2022-09-10 DIAGNOSIS — R519 Headache, unspecified: Secondary | ICD-10-CM | POA: Diagnosis not present

## 2022-09-10 DIAGNOSIS — H9311 Tinnitus, right ear: Secondary | ICD-10-CM

## 2022-11-10 ENCOUNTER — Other Ambulatory Visit: Payer: Self-pay | Admitting: Family Medicine

## 2022-11-10 DIAGNOSIS — G8929 Other chronic pain: Secondary | ICD-10-CM

## 2022-11-13 NOTE — Progress Notes (Unsigned)
Name: James Roman   MRN: [id number]    DOB: 03-26-1966   Date:11/16/2022       Progress Note  Subjective  Chief Complaint  Follow Up  HPI  Hyperglycemia: last hgbA1C was normal was 5.5 % , previously up to 5.8%, he denies polyphagia, polydipsia or polyuria.  Discussed low carbohydrate diet    Vitamin D deficiency: he must resume vitamin D 2000 units per day   B12: reminded him again to take SL b12   Hyperlipidemia: we gave him Rosuvastatin in  the past but he never took it. He states the a cardiac calcium score at the [place] and thinks it was normal . MRI brain showed tortuous vertebral artery  explain to him he must take statins and start aspirin 81 mg but he refuses    Neck pain:  No longer has tingling or numbness on hands and has been off Nortriptyline   I gave him skelaxin on his last visit but he states it did not help . 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 Malvin Johns in the past, and was given Nortriptyline.  He has different type of headaches, he states some are dull. He states sometimes it is throbbing, right frontal area, intense and can last all day and sometimes it lingers the following day . He has photophobia, not associated with nausea or vomiting, occasionally has to go home from work. Co-workers can tell when he has the severe headache. Happens about once a week now. When he came in October his headaches were getting worse and waking him up at night. We checked MRI of brain and showed tortuous vertebral artery and some mass effect on pons. We faxed results to Dr. Malvin Johns but he never got an appointment. We gave him Imitrex and he states it works well for him, taking it about once a week. I advised him to  start statin therapy and aspirin He went back and saw his PA, had MRA brain and was advised to resume nortriptyline , he is taking 25 mg before bed , headaches have decreased in frequency. He went up to 4 weeks  without a headache, on average 3 times a month   Asthma: doing well, no cough, wheezing or SOB, he has albuterol but has not used it in a long time. Unchanged   GERD: doing well at this time, no heartburn or indigestion. He has intermittent episodes of night. Episodes are episodic, he only takes PPI prn and stable    OA right knee: seen by [facility] and had Xray that showed mild medial OA with narrowing of joint space, he is on Meloxicam but only takign it prn, he uses exercise bands at home, he states feels achy on his joints at the end of the day, no effusion or redness or increase in warmth. Pain level is mild on his knee now    Palpitation: he has noticed palpitation over the past few months, an episodes about every two weeks, we can take minutes, not associated with sob, diaphoresis or chest pain. It feels like his heart is beating fast, usually at rest.   Patient Active Problem List   Diagnosis Date Noted   Chronic left-sided low back pain without sciatica 09/19/2018   Right knee pain 09/19/2018   Vitamin D deficiency 09/04/2016   Low serum phosphorus for age 42/23/2018   Hyperglycemia 04/21/2015   Hyperlipidemia 04/18/2015   Asthma, mild  intermittent, well-controlled 04/18/2015   GERD (gastroesophageal reflux disease) 04/18/2015   Cephalalgia 10/04/2014   Cervical pain 10/04/2014    No past surgical history on file.  Family History  Problem Relation Age of Onset   Cancer Mother    Hearing loss Father    Hyperlipidemia Father    Skin cancer Father        Back   Cancer Sister        Skin   Anemia Daughter     Social History   Tobacco Use   Smoking status: Never   Smokeless tobacco: Never  Substance Use Topics   Alcohol use: No    Alcohol/week: 0.0 standard drinks of alcohol     Current Outpatient Medications:    DULoxetine (CYMBALTA) 30 MG capsule, TAKE 1 CAPSULE BY MOUTH EVERY DAY, Disp: 90 capsule, Rfl: 0   meloxicam (MOBIC) 15 MG tablet, TAKE 1 TABLET (15  MG TOTAL) BY MOUTH DAILY., Disp: 90 tablet, Rfl: 1   nortriptyline (PAMELOR) 25 MG capsule, Take 25 mg by mouth at bedtime., Disp: , Rfl:    albuterol (VENTOLIN HFA) 108 (90 Base) MCG/ACT inhaler, Inhale 2 puffs into the lungs every 6 (six) hours as needed for wheezing or shortness of breath. (Patient not taking: Reported on 11/16/2022), Disp: 8 g, Rfl: 0   aspirin EC 81 MG tablet, Take 1 tablet (81 mg total) by mouth daily. Swallow whole. (Patient not taking: Reported on 11/16/2022), Disp: 30 tablet, Rfl: 12   Cholecalciferol (VITAMIN D3) 50 MCG (2000 UT) capsule, Take 1 capsule (2,000 Units total) by mouth daily. (Patient not taking: Reported on 11/16/2022), Disp: 100 capsule, Rfl: 1   Cyanocobalamin (B-12) 1000 MCG SUBL, Place 1 tablet under the tongue daily. (Patient not taking: Reported on 11/16/2022), Disp: 90 tablet, Rfl: 1   omeprazole (PRILOSEC) 20 MG capsule, Take 1 capsule (20 mg total) by mouth daily. (Patient not taking: Reported on 11/16/2022), Disp: 90 capsule, Rfl: 3   rosuvastatin (CRESTOR) 5 MG tablet, Take 1 tablet (5 mg total) by mouth daily. (Patient not taking: Reported on 11/16/2022), Disp: 90 tablet, Rfl: 1   SUMAtriptan (IMITREX) 100 MG tablet, TAKE 1 TABLET BY MOUTH EVERY 2 (TWO) HOURS AS NEEDED FOR MIGRAINE. MAY REPEAT IN 2 HOURS IF HEADACHE PERSISTS OR RECURS. (Patient not taking: Reported on 11/16/2022), Disp: 10 tablet, Rfl: 0  Allergies  Allergen Reactions   Penicillin G Other (See Comments)    Gets sick-vomiting    I personally reviewed active problem list, medication list, allergies, family history, social history, health maintenance with the patient/caregiver today.   ROS  Ten systems reviewed and is negative except as mentioned in HPI   Objective  Vitals:   11/16/22 0756  BP: 120/68  Pulse: 83  Resp: 16  SpO2: 96%  Weight: 143 lb (64.9 kg)  Height: 5\' 5"  (1.651 m)    Body mass index is 23.8 kg/m.  Physical Exam  Constitutional: Patient appears  well-developed and well-nourished.  No distress.  HEENT: head atraumatic, normocephalic, pupils equal and reactive to light, neck supple Cardiovascular: Normal rate, regular rhythm and normal heart sounds.  No murmur heard. No BLE edema. Pulmonary/Chest: Effort normal and breath sounds normal. No respiratory distress. Abdominal: Soft.  There is no tenderness. Psychiatric: Patient has a normal mood and affect. behavior is normal. Judgment and thought content normal.   PHQ2/9:    11/16/2022    7:55 AM 07/17/2022    7:47 AM 05/01/2022    7:44  AM 04/16/2022    7:44 AM 10/14/2021    7:41 AM  Depression screen PHQ 2/9  Decreased Interest 0 0 0 0 0  Down, Depressed, Hopeless 0 0 0 1 0  PHQ - 2 Score 0 0 0 1 0  Altered sleeping 0 0 0 0 0  Tired, decreased energy 0 2 1 0 0  Change in appetite 0 0 0 0 0  Feeling bad or failure about yourself  0 0 0 0 0  Trouble concentrating 0 0 0 0 0  Moving slowly or fidgety/restless 0 0 0 0 0  Suicidal thoughts 0 0 0 0 0  PHQ-9 Score 0 2 1 1  0    phq 9 is negative   Fall Risk:    11/16/2022    7:54 AM 07/17/2022    7:47 AM 05/01/2022    7:43 AM 04/16/2022    7:44 AM 10/14/2021    7:41 AM  Fall Risk   Falls in the past year? 0 0 0 0 0  Number falls in past yr: 0 0 0 0 0  Injury with Fall? 0 0 0 0 0  Risk for fall due to : No Fall Risks No Fall Risks No Fall Risks No Fall Risks No Fall Risks  Follow up Falls prevention discussed Falls prevention discussed Falls prevention discussed Falls prevention discussed Falls prevention discussed      Functional Status Survey: Is the patient deaf or have difficulty hearing?: Yes Does the patient have difficulty seeing, even when wearing glasses/contacts?: No Does the patient have difficulty concentrating, remembering, or making decisions?: No Does the patient have difficulty walking or climbing stairs?: No Does the patient have difficulty dressing or bathing?: No Does the patient have difficulty doing errands  alone such as visiting a doctor's office or shopping?: No    Assessment & Plan  1. Tortuous artery (HCC)  - Lipid panel  2. Headache disorder   3. Chronic neck pain  Doing well   4. Vitamin D deficiency  - VITAMIN D 25 Hydroxy (Vit-D Deficiency, Fractures)  5. Migraine without aura and without status migrainosus, not intractable  - SUMAtriptan (IMITREX) 100 MG tablet; May repeat in 2 hours if headache persists or recurs.  Dispense: 10 tablet; Refill: 0  6. Pure hypercholesterolemia  - Lipid panel  7. Gastroesophageal reflux disease without esophagitis  Taking prn medication   8. Asthma, mild intermittent, well-controlled   9. Chronic pain of right knee  Taking meloxicam when at work   10. Sensory hearing loss, bilateral  Stable  11. Vitamin B12 deficiency  - Vitamin B12  12. Hyperglycemia  - Hemoglobin A1c  13. Long-term use of high-risk medication  - CBC with Differential/Platelet - COMPLETE METABOLIC PANEL WITH GFR   14. Palpitation  - TSH   Discussed hydration, cutting down on caffeine. Time each episode and start a diary

## 2022-11-16 ENCOUNTER — Ambulatory Visit (INDEPENDENT_AMBULATORY_CARE_PROVIDER_SITE_OTHER): Payer: BC Managed Care – PPO | Admitting: Family Medicine

## 2022-11-16 ENCOUNTER — Encounter: Payer: Self-pay | Admitting: Family Medicine

## 2022-11-16 VITALS — BP 120/68 | HR 83 | Resp 16 | Ht 65.0 in | Wt 143.0 lb

## 2022-11-16 DIAGNOSIS — H903 Sensorineural hearing loss, bilateral: Secondary | ICD-10-CM

## 2022-11-16 DIAGNOSIS — G43009 Migraine without aura, not intractable, without status migrainosus: Secondary | ICD-10-CM | POA: Diagnosis not present

## 2022-11-16 DIAGNOSIS — K219 Gastro-esophageal reflux disease without esophagitis: Secondary | ICD-10-CM

## 2022-11-16 DIAGNOSIS — R739 Hyperglycemia, unspecified: Secondary | ICD-10-CM

## 2022-11-16 DIAGNOSIS — I771 Stricture of artery: Secondary | ICD-10-CM

## 2022-11-16 DIAGNOSIS — G8929 Other chronic pain: Secondary | ICD-10-CM

## 2022-11-16 DIAGNOSIS — E538 Deficiency of other specified B group vitamins: Secondary | ICD-10-CM

## 2022-11-16 DIAGNOSIS — E559 Vitamin D deficiency, unspecified: Secondary | ICD-10-CM

## 2022-11-16 DIAGNOSIS — E7800 Pure hypercholesterolemia, unspecified: Secondary | ICD-10-CM | POA: Diagnosis not present

## 2022-11-16 DIAGNOSIS — R002 Palpitations: Secondary | ICD-10-CM

## 2022-11-16 DIAGNOSIS — Z79899 Other long term (current) drug therapy: Secondary | ICD-10-CM | POA: Diagnosis not present

## 2022-11-16 DIAGNOSIS — J4520 Mild intermittent asthma, uncomplicated: Secondary | ICD-10-CM

## 2022-11-16 DIAGNOSIS — M542 Cervicalgia: Secondary | ICD-10-CM | POA: Diagnosis not present

## 2022-11-16 DIAGNOSIS — R519 Headache, unspecified: Secondary | ICD-10-CM

## 2022-11-16 DIAGNOSIS — M25561 Pain in right knee: Secondary | ICD-10-CM

## 2022-11-16 LAB — CBC WITH DIFFERENTIAL/PLATELET
Basophils Absolute: 28 cells/uL (ref 0–200)
Basophils Relative: 0.4 %
Eosinophils Absolute: 83 cells/uL (ref 15–500)
Eosinophils Relative: 1.2 %
Hemoglobin: 14.9 g/dL (ref 13.2–17.1)
Neutro Abs: 4057 cells/uL (ref 1500–7800)
Neutrophils Relative %: 58.8 %
RBC: 4.76 10*6/uL (ref 4.20–5.80)
Total Lymphocyte: 30.7 %

## 2022-11-16 MED ORDER — SUMATRIPTAN SUCCINATE 100 MG PO TABS: ORAL_TABLET | ORAL | 0 refills | Status: DC

## 2022-11-17 LAB — COMPLETE METABOLIC PANEL WITH GFR
AG Ratio: 1.8 (calc) (ref 1.0–2.5)
ALT: 13 U/L (ref 9–46)
AST: 12 U/L (ref 10–35)
Albumin: 4.4 g/dL (ref 3.6–5.1)
Alkaline phosphatase (APISO): 58 U/L (ref 35–144)
BUN: 20 mg/dL (ref 7–25)
CO2: 27 mmol/L (ref 20–32)
Calcium: 9.5 mg/dL (ref 8.6–10.3)
Chloride: 106 mmol/L (ref 98–110)
Creat: 1.07 mg/dL (ref 0.70–1.30)
Globulin: 2.5 g/dL (calc) (ref 1.9–3.7)
Glucose, Bld: 97 mg/dL (ref 65–99)
Potassium: 4.9 mmol/L (ref 3.5–5.3)
Sodium: 140 mmol/L (ref 135–146)
Total Bilirubin: 0.3 mg/dL (ref 0.2–1.2)
Total Protein: 6.9 g/dL (ref 6.1–8.1)
eGFR: 80 mL/min/{1.73_m2} (ref >=60)

## 2022-11-17 LAB — LIPID PANEL
Cholesterol: 204 mg/dL — ABNORMAL HIGH (ref <200)
HDL: 46 mg/dL (ref >=40)
LDL Cholesterol (Calc): 137 mg/dL (calc) — ABNORMAL HIGH
Non-HDL Cholesterol (Calc): 158 mg/dL (calc) — ABNORMAL HIGH (ref <130)
Total CHOL/HDL Ratio: 4.4 (calc) (ref <5.0)
Triglycerides: 103 mg/dL (ref <150)

## 2022-11-17 LAB — HEMOGLOBIN A1C
Hgb A1c MFr Bld: 5.6 % of total Hgb (ref <5.7)
Mean Plasma Glucose: 114 mg/dL
eAG (mmol/L): 6.3 mmol/L

## 2022-11-17 LAB — TSH: TSH: 1.21 mIU/L (ref 0.40–4.50)

## 2022-11-17 LAB — VITAMIN B12: Vitamin B-12: 298 pg/mL (ref 200–1100)

## 2022-11-17 LAB — VITAMIN D 25 HYDROXY (VIT D DEFICIENCY, FRACTURES): Vit D, 25-Hydroxy: 26 ng/mL — ABNORMAL LOW (ref 30–100)

## 2022-11-17 LAB — CBC WITH DIFFERENTIAL/PLATELET
Absolute Monocytes: 614 cells/uL (ref 200–950)
HCT: 44.5 % (ref 38.5–50.0)
Lymphs Abs: 2118 cells/uL (ref 850–3900)
MCH: 31.3 pg (ref 27.0–33.0)
MCHC: 33.5 g/dL (ref 32.0–36.0)
MCV: 93.5 fL (ref 80.0–100.0)
MPV: 9.9 fL (ref 7.5–12.5)
Monocytes Relative: 8.9 %
Platelets: 329 10*3/uL (ref 140–400)
RDW: 12.1 % (ref 11.0–15.0)
WBC: 6.9 10*3/uL (ref 3.8–10.8)

## 2023-01-01 DIAGNOSIS — M1711 Unilateral primary osteoarthritis, right knee: Secondary | ICD-10-CM | POA: Diagnosis not present

## 2023-01-01 DIAGNOSIS — R918 Other nonspecific abnormal finding of lung field: Secondary | ICD-10-CM | POA: Diagnosis not present

## 2023-01-01 DIAGNOSIS — E785 Hyperlipidemia, unspecified: Secondary | ICD-10-CM | POA: Diagnosis not present

## 2023-01-01 DIAGNOSIS — R519 Headache, unspecified: Secondary | ICD-10-CM | POA: Diagnosis not present

## 2023-01-18 ENCOUNTER — Other Ambulatory Visit: Payer: Self-pay | Admitting: Family Medicine

## 2023-01-18 DIAGNOSIS — M542 Cervicalgia: Secondary | ICD-10-CM

## 2023-01-18 DIAGNOSIS — M25561 Pain in right knee: Secondary | ICD-10-CM

## 2023-01-26 DIAGNOSIS — G479 Sleep disorder, unspecified: Secondary | ICD-10-CM | POA: Diagnosis not present

## 2023-01-26 DIAGNOSIS — R519 Headache, unspecified: Secondary | ICD-10-CM | POA: Diagnosis not present

## 2023-04-04 DIAGNOSIS — S6992XS Unspecified injury of left wrist, hand and finger(s), sequela: Secondary | ICD-10-CM | POA: Insufficient documentation

## 2023-04-07 ENCOUNTER — Ambulatory Visit: Payer: Self-pay | Admitting: *Deleted

## 2023-04-07 DIAGNOSIS — S62615A Displaced fracture of proximal phalanx of left ring finger, initial encounter for closed fracture: Secondary | ICD-10-CM | POA: Diagnosis not present

## 2023-04-07 NOTE — Telephone Encounter (Signed)
Reason for Disposition  Finger joint can't be opened (straightened) or closed (bent) completely  (Note: injured person should be able to do this without assistance)  Answer Assessment - Initial Assessment Questions 1. MECHANISM: "How did the injury happen?"      Got caught between 2 rollers-  2. ONSET: "When did the injury happen?" (Minutes or hours ago)      Sunday am 3. LOCATION: "What part of the finger is injured?" "Is the nail damaged?"      Ring finger-left 4. APPEARANCE of the INJURY: "What does the injury look like?"      Swelling day of- swelling is down 5. SEVERITY: "Can you use the hand normally?"  "Can you bend your fingers into a ball and then fully open them?"     Unable to make fist- undependably does not work 6. SIZE: For cuts, bruises, or swelling, ask: "How large is it?" (e.g., inches or centimeters;  entire finger)      Bruising presently 7. PAIN: "Is there pain?" If Yes, ask: "How bad is the pain?"    (e.g., Scale 1-10; or mild, moderate, severe)  - NONE (0): no pain.  - MILD (1-3): doesn't interfere with normal activities.   - MODERATE (4-7): interferes with normal activities or awakens from sleep.  - SEVERE (8-10): excruciating pain, unable to hold a glass of water or bend finger even a little.     moderate 8. TETANUS: For any breaks in the skin, ask: "When was the last tetanus booster?"     Up to date 9. OTHER SYMPTOMS: "Do you have any other symptoms?"     no  Protocols used: Finger Injury-A-AH

## 2023-04-07 NOTE — Telephone Encounter (Signed)
  Chief Complaint: injury to finger Symptoms: patient states injury happened at work Sunday- swelling has finally gone down and there is some deformity of the finger and bruising. R ring finger- wedding band broke during accident- unable to move finger normally- can't not bend independently.  Frequency: accident Sunday Pertinent Negatives: Patient denies numbness, tingling Disposition: [] ED /[x] Urgent Care (no appt availability in office) / [] Appointment(In office/virtual)/ []  [facility]/ [] Home Care/ [] Refused Recommended Disposition /[] [facility]/ []  Follow-up with PCP Additional Notes: No open appointment in office- advised Ortho UC for evaluation.

## 2023-05-04 DIAGNOSIS — S62615A Displaced fracture of proximal phalanx of left ring finger, initial encounter for closed fracture: Secondary | ICD-10-CM | POA: Diagnosis not present

## 2023-05-06 NOTE — Progress Notes (Signed)
Name: James Roman   MRN: [id number]    DOB: [birth year]   Date:05/07/2023       Progress Note  Subjective  Chief Complaint  Annual Exam  HPI  Patient presents for annual CPE.  IPSS Questionnaire (AUA-7): Over the past month.   1)  How often have you had a sensation of not emptying your bladder completely after you finish urinating?  0 - Not at all  2)  How often have you had to urinate again less than two hours after you finished urinating? 0 - Not at all  3)  How often have you found you stopped and started again several times when you urinated?  0 - Not at all  4) How difficult have you found it to postpone urination?  0 - Not at all  5) How often have you had a weak urinary stream?  0 - Not at all  6) How often have you had to push or strain to begin urination?  0 - Not at all  7) How many times did you most typically get up to urinate from the time you went to bed until the time you got up in the morning?  0 - None  Total score:  0-7 mildly symptomatic   8-19 moderately symptomatic   20-35 severely symptomatic     Diet: meat and potatoes Exercise: he has a physical job Last Dental Exam: up to date  Last Eye Exam: up to date   Depression: phq 9 is negative    05/07/2023    8:01 AM 11/16/2022    7:55 AM 07/17/2022    7:47 AM 05/01/2022    7:44 AM 04/16/2022    7:44 AM  Depression screen PHQ 2/9  Decreased Interest 0 0 0 0 0  Down, Depressed, Hopeless 0 0 0 0 1  PHQ - 2 Score 0 0 0 0 1  Altered sleeping 1 0 0 0 0  Tired, decreased energy 0 0 2 1 0  Change in appetite 0 0 0 0 0  Feeling bad or failure about yourself  0 0 0 0 0  Trouble concentrating 0 0 0 0 0  Moving slowly or fidgety/restless 0 0 0 0 0  Suicidal thoughts 0 0 0 0 0  PHQ-9 Score 1 0 2 1 1     Hypertension:  BP Readings from Last 3 Encounters:  05/07/23 118/72  11/16/22 120/68  07/17/22 126/70    Obesity: Wt Readings from Last 3 Encounters:  05/07/23 139 lb (63 kg)  11/16/22 143 lb (64.9  kg)  07/17/22 145 lb (65.8 kg)   BMI Readings from Last 3 Encounters:  05/07/23 23.13 kg/m  11/16/22 23.80 kg/m  07/17/22 24.13 kg/m     Lipids:  Lab Results  Component Value Date   CHOL 204 (H) 11/16/2022   CHOL 203 (H) 10/14/2021   CHOL 207 (A) 04/02/2021   Lab Results  Component Value Date   HDL 46 11/16/2022   HDL 44 10/14/2021   HDL 45 04/02/2021   Lab Results  Component Value Date   LDLCALC 137 (H) 11/16/2022   LDLCALC 132 (H) 10/14/2021   LDLCALC 136 04/02/2021   Lab Results  Component Value Date   TRIG 103 11/16/2022   TRIG 152 (H) 10/14/2021   TRIG 244 (A) 04/02/2021   Lab Results  Component Value Date   CHOLHDL 4.4 11/16/2022   CHOLHDL 4.6 10/14/2021   CHOLHDL 6.1 (H) 10/03/2019   No results found  for: "LDLDIRECT" Glucose:  Glucose  Date Value Ref Range Status  04/11/2020 93 65 - 99 mg/dL Final  29/56/2130 865 (H) 65 - 99 mg/dL Final   Glucose, Bld  Date Value Ref Range Status  11/16/2022 97 65 - 99 mg/dL Final    Comment:    .            Fasting reference interval .   10/14/2021 91 65 - 99 mg/dL Final    Comment:    .            Fasting reference interval .   10/03/2019 90 65 - 99 mg/dL Final    Comment:    .            Fasting reference interval .     Flowsheet Row Office Visit from 04/16/20[facility] in [facility]  AUDIT-C Score 0       Married STD testing and prevention (HIV/chl/gon/syphilis): N/A Sexual history: normal libido, no ED Hep C Screening: 03/23/12 Skin cancer: Discussed monitoring for atypical lesions Colorectal cancer: 05/25/14 Prostate cancer:   Lab Results  Component Value Date   PSA 0.69 08/19/2018   PSA 1.0 08/27/2016   PSA 1.1 normal 04/19/2014     Lung cancer:  Low Dose CT Chest recommended if Age 22-80 years, 30 pack-year currently smoking OR have quit w/in 15years. Patient  is not  a candidate for screening   AAA: The USPSTF recommends one-time screening with  ultrasonography in men ages 77 to 75 years who have ever smoked. Patient is  not a candidate for screening  ECG:  04/13/12  Vaccines:   HPV: N/A Tdap: up to date Shingrix: up to date Pneumonia: up to date 04/2021  Flu: he refused  COVID-19: up to date  Advanced Care Planning: A voluntary discussion about advance care planning including the explanation and discussion of advance directives.  Discussed health care proxy and Living will, and the patient was able to identify a health care proxy as wife.  Patient does not have a living will and power of attorney of health care   Patient Active Problem List   Diagnosis Date Noted   Chronic left-sided low back pain without sciatica 09/19/2018   Right knee pain 09/19/2018   Vitamin D deficiency 09/04/2016   Low serum phosphorus for age 21/23/2018   Hyperglycemia 04/21/2015   Hyperlipidemia 04/18/2015   Asthma, mild intermittent, well-controlled 04/18/2015   GERD (gastroesophageal reflux disease) 04/18/2015   Cephalalgia 10/04/2014   Cervical pain 10/04/2014    No past surgical history on file.  Family History  Problem Relation Age of Onset   Cancer Mother    Hearing loss Father    Hyperlipidemia Father    Skin cancer Father        Back   Cancer Sister        Skin   Anemia Daughter     Social History   Socioeconomic History   Marital status: Married    Spouse name: Darl Pikes   Number of children: 2   Years of education: Not on file   Highest education level: High school graduate  Occupational History   Not on file  Tobacco Use   Smoking status: Never   Smokeless tobacco: Never  Vaping Use   Vaping status: Never Used  Substance and Sexual Activity   Alcohol use: No    Alcohol/week: 0.0 standard drinks of alcohol   Drug use: No   Sexual activity: Yes  Partners: Female  Other Topics Concern   Not on file  Social History Narrative   Married    Working at [employer] ( part of [employer])   Lead person - assembly line        Patient has 2 daughters & 4 step-daughters   3 grandkids      Social Determinants of Health   Financial Resource Strain: Low Risk  (05/07/2023)   Overall Financial Resource Strain (CARDIA)    Difficulty of Paying Living Expenses: Not hard at all  Food Insecurity: No Food Insecurity (05/07/2023)   Hunger Vital Sign    Worried About Running Out of Food in the Last Year: Never true    Ran Out of Food in the Last Year: Never true  Transportation Needs: No Transportation Needs (05/07/2023)   PRAPARE - Administrator, Civil Service (Medical): No    Lack of Transportation (Non-Medical): No  Physical Activity: Inactive (05/07/2023)   Exercise Vital Sign    Days of Exercise per Week: 0 days    Minutes of Exercise per Session: 0 min  Stress: No Stress Concern Present (05/07/2023)   [employer] of Occupational Health - Occupational Stress Questionnaire    Feeling of Stress : Only a little  Social Connections: Moderately Isolated (05/07/2023)   Social Connection and Isolation Panel [NHANES]    Frequency of Communication with Friends and Family: More than three times a week    Frequency of Social Gatherings with Friends and Family: Once a week    Attends Religious Services: Never    Database administrator or Organizations: No    Attends Banker Meetings: Never    Marital Status: Married  Catering manager Violence: Not At Risk (05/07/2023)   Humiliation, Afraid, Rape, and Kick questionnaire    Fear of Current or Ex-Partner: No    Emotionally Abused: No    Physically Abused: No    Sexually Abused: No     Current Outpatient Medications:    Cholecalciferol (VITAMIN D3) 50 MCG (2000 UT) capsule, Take 1 capsule (2,000 Units total) by mouth daily., Disp: 100 capsule, Rfl: 1   meloxicam (MOBIC) 15 MG tablet, TAKE 1 TABLET (15 MG TOTAL) BY MOUTH DAILY., Disp: 90 tablet, Rfl: 1   nortriptyline (PAMELOR) 25 MG capsule, Take 25 mg by mouth at bedtime., Disp: ,  Rfl:    omeprazole (PRILOSEC) 20 MG capsule, Take 1 capsule (20 mg total) by mouth daily., Disp: 90 capsule, Rfl: 3   SUMAtriptan (IMITREX) 100 MG tablet, May repeat in 2 hours if headache persists or recurs., Disp: 10 tablet, Rfl: 0   albuterol (VENTOLIN HFA) 108 (90 Base) MCG/ACT inhaler, Inhale 2 puffs into the lungs every 6 (six) hours as needed for wheezing or shortness of breath. (Patient not taking: Reported on 11/16/2022), Disp: 8 g, Rfl: 0   Cyanocobalamin (B-12) 1000 MCG SUBL, Place 1 tablet under the tongue daily., Disp: 90 tablet, Rfl: 1  Allergies  Allergen Reactions   Penicillin G Other (See Comments)    Gets sick-vomiting     ROS  Constitutional: Negative for fever , mild weight loss Respiratory: Negative for cough and shortness of breath.   Cardiovascular: Negative for chest pain or palpitations.  Gastrointestinal: Negative for abdominal pain, no bowel changes.  Musculoskeletal: Negative for gait problem or joint swelling.  Skin: Negative for rash.  Neurological: Negative for dizziness or headache.  No other specific complaints in a complete review of systems (except as  listed in HPI above).   Objective  Vitals:   05/07/23 0758  BP: 118/72  Pulse: 64  Resp: 16  SpO2: 99%  Weight: 139 lb (63 kg)  Height: 5\' 5"  (1.651 m)    Body mass index is 23.13 kg/m.  Physical Exam  Constitutional: Patient appears well-developed and well-nourished. No distress.  HENT: Head: Normocephalic and atraumatic. Ears: B TMs ok, no erythema or effusion; Nose: Nose normal. Mouth/Throat: Oropharynx is clear and moist. No oropharyngeal exudate.  Eyes: Conjunctivae and EOM are normal. Pupils are equal, round, and reactive to light. No scleral icterus.  Neck: Normal range of motion. Neck supple. No JVD present. No thyromegaly present.  Cardiovascular: Normal rate, regular rhythm and normal heart sounds.  No murmur heard. No BLE edema. Pulmonary/Chest: Effort normal and breath sounds  normal. No respiratory distress. Abdominal: Soft. Bowel sounds are normal, no distension. There is no tenderness. no masses MALE GENITALIA: Normal descended testes bilaterally, no masses palpated, no hernias, no lesions, no discharge RECTAL: not done  Musculoskeletal: Normal range of motion, no joint effusions. No gross deformities Neurological: he is alert and oriented to person, place, and time. No cranial nerve deficit. Coordination, balance, strength, speech and gait are normal.  Skin: Skin is warm and dry. No rash noted. No erythema.  Psychiatric: Patient has a normal mood and affect. behavior is normal. Judgment and thought content normal.   Fall Risk:    05/07/2023    8:01 AM 11/16/2022    7:54 AM 07/17/2022    7:47 AM 05/01/2022    7:43 AM 04/16/2022    7:44 AM  Fall Risk   Falls in the past year? 0 0 0 0 0  Number falls in past yr: 0 0 0 0 0  Injury with Fall? 0 0 0 0 0  Risk for fall due to : No Fall Risks No Fall Risks No Fall Risks No Fall Risks No Fall Risks  Follow up Falls prevention discussed Falls prevention discussed Falls prevention discussed Falls prevention discussed Falls prevention discussed     Functional Status Survey: Is the patient deaf or have difficulty hearing?: No Does the patient have difficulty seeing, even when wearing glasses/contacts?: No Does the patient have difficulty concentrating, remembering, or making decisions?: No Does the patient have difficulty walking or climbing stairs?: No Does the patient have difficulty dressing or bathing?: No Does the patient have difficulty doing errands alone such as visiting a doctor's office or shopping?: No    Assessment & Plan  1. Well adult exam   2. Vitamin B12 deficiency  - Cyanocobalamin (B-12) 1000 MCG SUBL; Place 1 tablet under the tongue daily.  Dispense: 90 tablet; Refill: 1     -Prostate cancer screening and PSA options (with potential risks and benefits of testing vs not testing) were  discussed along with recent recs/guidelines. -USPSTF grade A and B recommendations reviewed with patient; age-appropriate recommendations, preventive care, screening tests, etc discussed and encouraged; healthy living encouraged; see AVS for patient education given to patient -Discussed importance of 150 minutes of physical activity weekly, eat two servings of fish weekly, eat one serving of tree nuts ( cashews, pistachios, pecans, almonds.Marland Kitchen) every other day, eat 6 servings of fruit/vegetables daily and drink plenty of water and avoid sweet beverages.  -Reviewed Health Maintenance: yes

## 2023-05-07 ENCOUNTER — Encounter: Payer: Self-pay | Admitting: Family Medicine

## 2023-05-07 ENCOUNTER — Ambulatory Visit (INDEPENDENT_AMBULATORY_CARE_PROVIDER_SITE_OTHER): Payer: BC Managed Care – PPO | Admitting: Family Medicine

## 2023-05-07 VITALS — BP 118/72 | HR 64 | Resp 16 | Ht 65.0 in | Wt 139.0 lb

## 2023-05-07 DIAGNOSIS — Z0000 Encounter for general adult medical examination without abnormal findings: Secondary | ICD-10-CM

## 2023-05-07 DIAGNOSIS — E538 Deficiency of other specified B group vitamins: Secondary | ICD-10-CM | POA: Diagnosis not present

## 2023-05-07 DIAGNOSIS — Z0001 Encounter for general adult medical examination with abnormal findings: Secondary | ICD-10-CM

## 2023-05-07 MED ORDER — B-12 1000 MCG SL SUBL
1.0000 | SUBLINGUAL_TABLET | Freq: Every day | SUBLINGUAL | 1 refills | Status: AC
Start: 2023-05-07 — End: ?

## 2023-05-20 NOTE — Progress Notes (Signed)
Name: James Roman   MRN: [id number]    DOB: July 07, 1966   Date:05/21/2023       Progress Note  Subjective  Chief Complaint  Follow Up  HPI  Hyperglycemia: last hgbA1C was normal was 5.5 % , previously up to 5.8%, he denies polyphagia, polydipsia or polyuria.  Discussed low carbohydrate diet    Vitamin D deficiency: reminded him to take otc vitamin D   B12: reminded him again to take SL b12, he has not purchased it yet    Hyperlipidemia: we gave him Rosuvastatin in  the past but he never took it. He states the a cardiac calcium score at the [place] and thinks it was normal . MRI brain showed tortuous vertebral artery  explain to him he must take statins and start aspirin 81 mg but he continues to refuse medication   Neck pain:  No longer has tingling or numbness on hands and has been off Nortriptyline   I gave him skelaxin on his last visit but he states it did not help . He is doing well , no pain on his neck at this time   Headaches/abnormal MRI brain - tortuous vertebral artery: seen by Dr. Malvin Johns in the past, and was given Nortriptyline.  He has different type of headaches, he states some are dull. He states sometimes it is throbbing, right frontal area, intense and can last all day and sometimes it lingers the following day . He has photophobia, not associated with nausea or vomiting, occasionally has to go home from work. We checked MRI of brain and showed tortuous vertebral artery and some mass effect on pons. We gave him Imitrex and he states it works well for him, taking it about once a week. I advised him to  start statin therapy and aspirin he stopped taking Nortriptyline since episodes are infrequent now, at most once a month    Asthma: doing well, no cough, wheezing or SOB, he has albuterol but has not used it in a long time. He has not been using any medication    GERD: doing well at this time, no heartburn or indigestion. He has intermittent episodes of night but not in a long  time    OA right knee: seen by [facility] and had Xray that showed mild medial OA with narrowing of joint space, he is on Meloxicam but only takign it prn, he uses exercise bands at home, he states feels achy on his joints at the end of the day, no effusion or redness or increase in warmth. Pain on his knee at this time is zero   Palpitation: it has been a while now, not bothering him anymore   Finger injury at work on 09/22, a machine got stuck on his wedding band and dragged his finger down, it was a crush injury with ligament tears but he does not want surgery   Patient Active Problem List   Diagnosis Date Noted   Chronic left-sided low back pain without sciatica 09/19/2018   Right knee pain 09/19/2018   Vitamin D deficiency 09/04/2016   Low serum phosphorus for age 55/23/2018   Hyperglycemia 04/21/2015   Hyperlipidemia 04/18/2015   Asthma, mild intermittent, well-controlled 04/18/2015   GERD (gastroesophageal reflux disease) 04/18/2015   Cephalalgia 10/04/2014   Cervical pain 10/04/2014    No past surgical history on file.  Family History  Problem Relation Age of Onset   Cancer Mother    Hearing loss Father    Hyperlipidemia  Father    Skin cancer Father        Back   Cancer Sister        Skin   Anemia Daughter     Social History   Tobacco Use   Smoking status: Never   Smokeless tobacco: Never  Substance Use Topics   Alcohol use: No    Alcohol/week: 0.0 standard drinks of alcohol     Current Outpatient Medications:    Cholecalciferol (VITAMIN D3) 50 MCG (2000 UT) capsule, Take 1 capsule (2,000 Units total) by mouth daily., Disp: 100 capsule, Rfl: 1   Cyanocobalamin (B-12) 1000 MCG SUBL, Place 1 tablet under the tongue daily., Disp: 90 tablet, Rfl: 1   meloxicam (MOBIC) 15 MG tablet, TAKE 1 TABLET (15 MG TOTAL) BY MOUTH DAILY., Disp: 90 tablet, Rfl: 1   nortriptyline (PAMELOR) 25 MG capsule, Take 25 mg by mouth at bedtime., Disp: , Rfl:    omeprazole (PRILOSEC)  20 MG capsule, Take 1 capsule (20 mg total) by mouth daily., Disp: 90 capsule, Rfl: 3   SUMAtriptan (IMITREX) 100 MG tablet, May repeat in 2 hours if headache persists or recurs., Disp: 10 tablet, Rfl: 0   albuterol (VENTOLIN HFA) 108 (90 Base) MCG/ACT inhaler, Inhale 2 puffs into the lungs every 6 (six) hours as needed for wheezing or shortness of breath. (Patient not taking: Reported on 11/16/2022), Disp: 8 g, Rfl: 0  Allergies  Allergen Reactions   Penicillin G Other (See Comments)    Gets sick-vomiting    I personally reviewed active problem list, medication list, allergies, family history, social history, health maintenance with the patient/caregiver today.   ROS  Ten systems reviewed and is negative except as mentioned in HPI    Objective  Vitals:   05/21/23 0755  BP: 118/68  Pulse: 69  Resp: 16  SpO2: 97%  Weight: 141 lb (64 kg)  Height: 5\' 5"  (1.651 m)    Body mass index is 23.46 kg/m.  Physical Exam  Constitutional: Patient appears well-developed and well-nourished.  No distress.  HEENT: head atraumatic, normocephalic, pupils equal and reactive to light, ears , neck supple, throat within normal limits Cardiovascular: Normal rate, regular rhythm and normal heart sounds.  No murmur heard. No BLE edema. Pulmonary/Chest: Effort normal and breath sounds normal. No respiratory distress. Muscular skeletal: ringer left finger decrease rom Abdominal: Soft.  There is no tenderness. Psychiatric: Patient has a normal mood and affect. behavior is normal. Judgment and thought content normal.   PHQ2/9:    05/21/2023    7:55 AM 05/07/2023    8:01 AM 11/16/2022    7:55 AM 07/17/2022    7:47 AM 05/01/2022    7:44 AM  Depression screen PHQ 2/9  Decreased Interest 0 0 0 0 0  Down, Depressed, Hopeless 0 0 0 0 0  PHQ - 2 Score 0 0 0 0 0  Altered sleeping 0 1 0 0 0  Tired, decreased energy 0 0 0 2 1  Change in appetite 0 0 0 0 0  Feeling bad or failure about yourself  0 0 0 0 0   Trouble concentrating 0 0 0 0 0  Moving slowly or fidgety/restless 0 0 0 0 0  Suicidal thoughts 0 0 0 0 0  PHQ-9 Score 0 1 0 2 1    phq 9 is negative   Fall Risk:    05/21/2023    7:55 AM 05/07/2023    8:01 AM 11/16/2022    7:54  AM 07/17/2022    7:47 AM 05/01/2022    7:43 AM  Fall Risk   Falls in the past year? 0 0 0 0 0  Number falls in past yr: 0 0 0 0 0  Injury with Fall? 0 0 0 0 0  Risk for fall due to : No Fall Risks No Fall Risks No Fall Risks No Fall Risks No Fall Risks  Follow up Falls prevention discussed Falls prevention discussed Falls prevention discussed Falls prevention discussed Falls prevention discussed      Functional Status Survey: Is the patient deaf or have difficulty hearing?: No Does the patient have difficulty seeing, even when wearing glasses/contacts?: No Does the patient have difficulty concentrating, remembering, or making decisions?: No Does the patient have difficulty walking or climbing stairs?: No Does the patient have difficulty dressing or bathing?: No Does the patient have difficulty doing errands alone such as visiting a doctor's office or shopping?: No    Assessment & Plan  1. Injury, finger, left, sequela  Keep follow up with ortho  2. Migraine without aura and without status migrainosus, not intractable  - SUMAtriptan (IMITREX) 100 MG tablet; May repeat in 2 hours if headache persists or recurs.  Dispense: 10 tablet; Refill: 0  3. Tortuous artery (HCC)  Refuses statin therapy   4. Gastroesophageal reflux disease without esophagitis  - omeprazole (PRILOSEC) 20 MG capsule; Take 1 capsule (20 mg total) by mouth daily.  Dispense: 90 capsule; Refill: 3  5. Vitamin D deficiency  - Cholecalciferol (VITAMIN D3) 50 MCG (2000 UT) capsule; Take 1 capsule (2,000 Units total) by mouth daily.  Dispense: 100 capsule; Refill: 1  6. Chronic neck pain  Stable  7. Asthma, mild intermittent, well-controlled  No problems at this  time  8. Pure hypercholesterolemia   Recheck it yearly

## 2023-05-21 ENCOUNTER — Encounter: Payer: Self-pay | Admitting: Family Medicine

## 2023-05-21 ENCOUNTER — Ambulatory Visit: Payer: BC Managed Care – PPO | Admitting: Family Medicine

## 2023-05-21 VITALS — BP 118/68 | HR 69 | Resp 16 | Ht 65.0 in | Wt 141.0 lb

## 2023-05-21 DIAGNOSIS — G43009 Migraine without aura, not intractable, without status migrainosus: Secondary | ICD-10-CM

## 2023-05-21 DIAGNOSIS — J4520 Mild intermittent asthma, uncomplicated: Secondary | ICD-10-CM

## 2023-05-21 DIAGNOSIS — M542 Cervicalgia: Secondary | ICD-10-CM

## 2023-05-21 DIAGNOSIS — E7800 Pure hypercholesterolemia, unspecified: Secondary | ICD-10-CM

## 2023-05-21 DIAGNOSIS — E559 Vitamin D deficiency, unspecified: Secondary | ICD-10-CM

## 2023-05-21 DIAGNOSIS — S6992XS Unspecified injury of left wrist, hand and finger(s), sequela: Secondary | ICD-10-CM | POA: Diagnosis not present

## 2023-05-21 DIAGNOSIS — K219 Gastro-esophageal reflux disease without esophagitis: Secondary | ICD-10-CM

## 2023-05-21 DIAGNOSIS — G8929 Other chronic pain: Secondary | ICD-10-CM

## 2023-05-21 DIAGNOSIS — I771 Stricture of artery: Secondary | ICD-10-CM

## 2023-05-21 MED ORDER — VITAMIN D3 50 MCG (2000 UT) PO CAPS
2000.0000 [IU] | ORAL_CAPSULE | Freq: Every day | ORAL | 1 refills | Status: AC
Start: 2023-05-21 — End: ?

## 2023-05-21 MED ORDER — SUMATRIPTAN SUCCINATE 100 MG PO TABS: ORAL_TABLET | ORAL | 0 refills | Status: AC

## 2023-05-21 MED ORDER — OMEPRAZOLE 20 MG PO CPDR
20.0000 mg | DELAYED_RELEASE_CAPSULE | Freq: Every day | ORAL | 3 refills | Status: AC
Start: 2023-05-21 — End: ?

## 2023-07-16 DIAGNOSIS — M5441 Lumbago with sciatica, right side: Secondary | ICD-10-CM | POA: Diagnosis not present

## 2023-09-13 DIAGNOSIS — R519 Headache, unspecified: Secondary | ICD-10-CM | POA: Diagnosis not present

## 2023-09-13 DIAGNOSIS — G479 Sleep disorder, unspecified: Secondary | ICD-10-CM | POA: Diagnosis not present

## 2023-10-21 ENCOUNTER — Ambulatory Visit (INDEPENDENT_AMBULATORY_CARE_PROVIDER_SITE_OTHER)

## 2023-10-21 DIAGNOSIS — E538 Deficiency of other specified B group vitamins: Secondary | ICD-10-CM | POA: Diagnosis not present

## 2023-10-21 MED ORDER — CYANOCOBALAMIN 1000 MCG/ML IJ SOLN
1000.0000 ug | Freq: Once | INTRAMUSCULAR | Status: AC
  Administered 2023-10-21: 1000 ug via INTRAMUSCULAR

## 2024-03-15 DIAGNOSIS — R519 Headache, unspecified: Secondary | ICD-10-CM | POA: Diagnosis not present

## 2024-05-12 ENCOUNTER — Ambulatory Visit (INDEPENDENT_AMBULATORY_CARE_PROVIDER_SITE_OTHER): Payer: Self-pay | Admitting: Family Medicine

## 2024-05-12 ENCOUNTER — Encounter: Payer: Self-pay | Admitting: Family Medicine

## 2024-05-12 VITALS — BP 118/70 | HR 59 | Resp 16 | Ht 65.0 in | Wt 142.0 lb

## 2024-05-12 DIAGNOSIS — K219 Gastro-esophageal reflux disease without esophagitis: Secondary | ICD-10-CM | POA: Diagnosis not present

## 2024-05-12 DIAGNOSIS — Z0000 Encounter for general adult medical examination without abnormal findings: Secondary | ICD-10-CM

## 2024-05-12 DIAGNOSIS — G43009 Migraine without aura, not intractable, without status migrainosus: Secondary | ICD-10-CM

## 2024-05-12 DIAGNOSIS — I771 Stricture of artery: Secondary | ICD-10-CM

## 2024-05-12 DIAGNOSIS — J4520 Mild intermittent asthma, uncomplicated: Secondary | ICD-10-CM

## 2024-05-12 DIAGNOSIS — Z1211 Encounter for screening for malignant neoplasm of colon: Secondary | ICD-10-CM

## 2024-05-12 DIAGNOSIS — E7800 Pure hypercholesterolemia, unspecified: Secondary | ICD-10-CM

## 2024-05-12 DIAGNOSIS — E559 Vitamin D deficiency, unspecified: Secondary | ICD-10-CM

## 2024-05-12 DIAGNOSIS — E538 Deficiency of other specified B group vitamins: Secondary | ICD-10-CM

## 2024-05-12 DIAGNOSIS — R739 Hyperglycemia, unspecified: Secondary | ICD-10-CM

## 2024-05-12 NOTE — Progress Notes (Signed)
 Name: James Roman   MRN: [id number]    DOB: [birth year]   Date:05/12/2024       Progress Note  Subjective  Chief Complaint  Chief Complaint  Patient presents with   Annual Exam    HPI  Patient presents for annual CPE and follow up  Discussed the use of AI scribe software for clinical note transcription with the patient, who gave verbal consent to proceed.  History of Present Illness James Roman is a 58 year old male who presents for follow-up of chronic neck pain, recurrent headaches, and migraines.  He experiences chronic neck pain, recurrent headaches, and migraines. Migraines occur with a pain level of 8, but he has not experienced one in 3-5 months. He describes another type of headache as dull and achy, occurring less frequently. He uses Goody powders or BC powders at work for relief.  He stopped taking Effexor due to sleepiness and currently uses Imitrex  as needed for migraines. He previously took nortriptyline  and meloxicam  but has discontinued them. He takes omeprazole  as needed for reflux, which he associates with weather changes, and purchases it over the counter.  He has mild intermittent asthma and uses albuterol  as needed. No current symptoms of cough, wheezing, or shortness of breath are reported.  He has a history of a finger injury from September 2024, which did not require surgery. He also has arthritis in the right knee, previously managed with meloxicam , but he no longer uses it. He mentions using exercise bands and strength training for knee management.  He has hypercholesterolemia but is not taking medications  His vitamin D  and B12 levels were previously low but also not taking supplements.  He wore a heart monitor due to palpitations, which have since resolved.  He has a history of a vertebral artery issue, but reports no current neck pain or radiculitis.  He works third shift, which affects his sleep. He denies any history of smoking. He mentions  eating a balanced diet, including a recent pot of vegetable soup.       IPSS     Row Name 05/12/24 9166         International Prostate Symptom Score   How often have you had the sensation of not emptying your bladder? Not at All     How often have you had to urinate less than every two hours? Not at All     How often have you found you stopped and started again several times when you urinated? Not at All     How often have you found it difficult to postpone urination? Not at All     How often have you had a weak urinary stream? Not at All     How often have you had to strain to start urination? Not at All     How many times did you typically get up at night to urinate? None     Total IPSS Score 0        Diet:  Exercise: discussed increase in physical activity  Last Dental Exam: up to date Last Eye Exam: up to date   Depression: phq 9 is negative    05/12/2024    8:25 AM 05/21/2023    7:55 AM 05/07/2023    8:01 AM 11/16/2022    7:55 AM 07/17/2022    7:47 AM  Depression screen PHQ 2/9  Decreased Interest 0 0 0 0 0  Down, Depressed, Hopeless 0 0 0 0 0  PHQ - 2 Score 0 0 0 0 0  Altered sleeping  0 1 0 0  Tired, decreased energy  0 0 0 2  Change in appetite  0 0 0 0  Feeling bad or failure about yourself   0 0 0 0  Trouble concentrating  0 0 0 0  Moving slowly or fidgety/restless  0 0 0 0  Suicidal thoughts  0 0 0 0  PHQ-9 Score  0 1 0 2    Hypertension:  BP Readings from Last 3 Encounters:  05/12/24 118/70  05/21/23 118/68  05/07/23 118/72    Obesity: Wt Readings from Last 3 Encounters:  05/12/24 142 lb (64.4 kg)  05/21/23 141 lb (64 kg)  05/07/23 139 lb (63 kg)   BMI Readings from Last 3 Encounters:  05/12/24 23.63 kg/m  05/21/23 23.46 kg/m  05/07/23 23.13 kg/m     Flowsheet Row Office Visit from 05/12/2024 in [facility]  AUDIT-C Score 1      Married STD testing and prevention (HIV/chl/gon/syphilis):  not  applicable Sexual history:  Hep C Screening: completed Skin cancer: Discussed monitoring for atypical lesions Colorectal cancer: scheduled at the [place] for March 2 nd 2026 Prostate cancer:  not applicable Lab Results  Component Value Date   PSA 0.69 08/19/2018   PSA 1.0 08/27/2016   PSA 1.1 normal 04/19/2014     Lung cancer:  Low Dose CT Chest recommended if Age 27-80 years, 30 pack-year currently smoking OR have quit w/in 15years. Patient  is not a candidate for screening   AAA: The USPSTF recommends one-time screening with ultrasonography in men ages 6 to 75 years who have ever smoked. Patient   is not a candidate for screening  ECG:  2013  Vaccines: reviewed with the patient.   Advanced Care Planning: A voluntary discussion about advance care planning including the explanation and discussion of advance directives.  Discussed health care proxy and Living will, and the patient was able to identify a health care proxy as wife .  Patient does not have a living will and power of attorney of health care   Patient Active Problem List   Diagnosis Date Noted   Injury, finger, left, sequela 04/04/2023   Chronic left-sided low back pain without sciatica 09/19/2018   Right knee pain 09/19/2018   Vitamin D  deficiency 09/04/2016   Low serum phosphorus for age 74/23/2018   Hyperglycemia 04/21/2015   Hyperlipidemia 04/18/2015   Asthma, mild intermittent, well-controlled 04/18/2015   GERD (gastroesophageal reflux disease) 04/18/2015   Cephalalgia 10/04/2014   Cervical pain 10/04/2014    No past surgical history on file.  Family History  Problem Relation Age of Onset   Cancer Mother    Hearing loss Father    Hyperlipidemia Father    Skin cancer Father        Back   Cancer Sister        Skin   Anemia Daughter     Social History   Socioeconomic History   Marital status: Married    Spouse name: Devere   Number of children: 2   Years of education: Not on file   Highest education  level: 12th grade  Occupational History   Not on file  Tobacco Use   Smoking status: Never   Smokeless tobacco: Never  Vaping Use   Vaping status: Never Used  Substance and Sexual Activity   Alcohol use: No    Alcohol/week: 0.0 standard drinks of  alcohol   Drug use: No   Sexual activity: Yes    Partners: Female  Other Topics Concern   Not on file  Social History Narrative   Married    Working at [employer] ( part of [employer])   Lead person - assembly line       Patient has 2 daughters & 4 step-daughters   3 grandkids      Social Drivers of Corporate Investment Banker Strain: Low Risk  (05/09/2024)   Overall Financial Resource Strain (CARDIA)    Difficulty of Paying Living Expenses: Not very hard  Food Insecurity: No Food Insecurity (05/09/2024)   Hunger Vital Sign    Worried About Running Out of Food in the Last Year: Never true    Ran Out of Food in the Last Year: Never true  Transportation Needs: No Transportation Needs (05/09/2024)   PRAPARE - Administrator, Civil Service (Medical): No    Lack of Transportation (Non-Medical): No  Physical Activity: Insufficiently Active (05/09/2024)   Exercise Vital Sign    Days of Exercise per Week: 2 days    Minutes of Exercise per Session: 20 min  Stress: No Stress Concern Present (05/09/2024)   [employer] of Occupational Health - Occupational Stress Questionnaire    Feeling of Stress: Only a little  Social Connections: Moderately Integrated (05/09/2024)   Social Connection and Isolation Panel    Frequency of Communication with Friends and Family: More than three times a week    Frequency of Social Gatherings with Friends and Family: Once a week    Attends Religious Services: 1 to 4 times per year    Active Member of [employer] or Organizations: No    Attends Engineer, Structural: Not on file    Marital Status: Married  Catering Manager Violence: Not At Risk (05/12/2024)   Humiliation, Afraid, Rape,  and Kick questionnaire    Fear of Current or Ex-Partner: No    Emotionally Abused: No    Physically Abused: No    Sexually Abused: No     Current Outpatient Medications:    albuterol  (VENTOLIN  HFA) 108 (90 Base) MCG/ACT inhaler, Inhale 2 puffs into the lungs every 6 (six) hours as needed for wheezing or shortness of breath. (Patient not taking: Reported on 11/16/2022), Disp: 8 g, Rfl: 0   Cholecalciferol (VITAMIN D3) 50 MCG (2000 UT) capsule, Take 1 capsule (2,000 Units total) by mouth daily., Disp: 100 capsule, Rfl: 1   Cyanocobalamin  (B-12) 1000 MCG SUBL, Place 1 tablet under the tongue daily., Disp: 90 tablet, Rfl: 1   omeprazole  (PRILOSEC) 20 MG capsule, Take 1 capsule (20 mg total) by mouth daily., Disp: 90 capsule, Rfl: 3   SUMAtriptan  (IMITREX ) 100 MG tablet, May repeat in 2 hours if headache persists or recurs., Disp: 10 tablet, Rfl: 0  Allergies  Allergen Reactions   Penicillin G Other (See Comments)    Gets sick-vomiting     ROS  Constitutional: Negative for fever or weight change.  Respiratory: Negative for cough and shortness of breath.   Cardiovascular: Negative for chest pain or palpitations.  Gastrointestinal: Negative for abdominal pain, no bowel changes.  Musculoskeletal: Negative for gait problem or joint swelling.  Skin: Negative for rash.  Neurological: Negative for dizziness or headache.  No other specific complaints in a complete review of systems (except as listed in HPI above).    Objective  Vitals:   05/12/24 0831  BP: 118/70  Pulse: (!) 59  Resp: 16  SpO2: 99%  Weight: 142 lb (64.4 kg)  Height: 5' 5 (1.651 m)    Body mass index is 23.63 kg/m.  Physical Exam  Constitutional: Patient appears well-developed and well-nourished. No distress.  HENT: Head: Normocephalic and atraumatic. Ears: B TMs ok, no erythema or effusion; Nose: Nose normal. Mouth/Throat: Oropharynx is clear and moist. No oropharyngeal exudate.  Eyes: Conjunctivae and EOM are  normal. Pupils are equal, round, and reactive to light. No scleral icterus.  Neck: Normal range of motion. Neck supple. No JVD present. No thyromegaly present.  Cardiovascular: Normal rate, regular rhythm and normal heart sounds.  No murmur heard. No BLE edema. Pulmonary/Chest: Effort normal and breath sounds normal. No respiratory distress. Abdominal: Soft. Bowel sounds are normal, no distension. There is no tenderness. no masses MALE GENITALIA: Normal descended testes bilaterally, no masses palpated, no hernias, no lesions, no discharge RECTAL: N/A Musculoskeletal: Normal range of motion, no joint effusions. No gross deformities Neurological: he is alert and oriented to person, place, and time. No cranial nerve deficit. Coordination, balance, strength, speech and gait are normal.  Skin: Skin is warm and dry. No rash noted. No erythema.  Psychiatric: Patient has a normal mood and affect. behavior is normal. Judgment and thought content normal.      Assessment & Plan Chronic Headaches and Migraines Recurrent headaches, including migraines, managed with sumatriptan . Avoids Goody Powders due to gastrointestinal risks. Stopped Effexor and avoids baclofen due to drowsiness concerns. - Continue sumatriptan  as needed for migraines. - Avoid Goody Powders. - Consider baclofen for tension headaches if not operating machinery.  Gastroesophageal Reflux Disease (GERD) Occasional heartburn and indigestion managed with over-the-counter omeprazole . - Continue omeprazole  as needed. - Avoid Goody Powders.  Osteoarthritis of the Right Knee Mild osteoarthritis managed with exercise bands and strength training. - Continue exercise bands and strength training.  Mild Intermittent Asthma Albuterol  inhaler available if needed. - Keep albuterol  inhaler available.  Vitamin B12 Deficiency Low B12 levels. Advised to ensure regular supplement intake to prevent symptoms. - Take vitamin B12 supplements  daily.  Well Adult/General Health Maintenance Declined flu shot. Scheduled for colonoscopy. No prostate screening needed. Advised on diet and exercise. - Receive a flu shot. - Complete scheduled colonoscopy on March 2nd. - Advised on diet and exercise.  Goals of Care No living will. Wife designated for medical decisions. - Consider creating a living will.  Follow-up Advised to send recent lab results and scheduled for follow-up. - Send recent lab results from the [place]. - Return for follow-up visit in one month.      -Prostate cancer screening and PSA options (with potential risks and benefits of testing vs not testing) were discussed along with recent recs/guidelines. -USPSTF grade A and B recommendations reviewed with patient; age-appropriate recommendations, preventive care, screening tests, etc discussed and encouraged; healthy living encouraged; see AVS for patient education given to patient -Discussed importance of 150 minutes of physical activity weekly, eat two servings of fish weekly, eat one serving of tree nuts ( cashews, pistachios, pecans, almonds.SABRA) every other day, eat 6 servings of fruit/vegetables daily and drink plenty of water and avoid sweet beverages.  -Reviewed Health Maintenance: yes

## 2024-05-22 ENCOUNTER — Ambulatory Visit: Payer: Self-pay | Admitting: Family Medicine

## 2025-05-15 ENCOUNTER — Encounter: Admitting: Family Medicine
